# Patient Record
Sex: Male | Born: 1950 | Race: White | Hispanic: No | Marital: Married | Smoking: Former smoker
Health system: Southern US, Community
[De-identification: ages and names within clinical notes are randomized; demographics above are authoritative.]

## PROBLEM LIST (undated history)

## (undated) DIAGNOSIS — M1990 Unspecified osteoarthritis, unspecified site: Secondary | ICD-10-CM

## (undated) DIAGNOSIS — Z8719 Personal history of other diseases of the digestive system: Secondary | ICD-10-CM

## (undated) DIAGNOSIS — K219 Gastro-esophageal reflux disease without esophagitis: Secondary | ICD-10-CM

## (undated) DIAGNOSIS — I10 Essential (primary) hypertension: Secondary | ICD-10-CM

## (undated) DIAGNOSIS — R911 Solitary pulmonary nodule: Secondary | ICD-10-CM

## (undated) HISTORY — PX: SPLENECTOMY, TOTAL: SHX788

## (undated) HISTORY — PX: CYST EXCISION: SHX5701

---
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
## 1998-03-12 ENCOUNTER — Encounter: Admission: RE | Admit: 1998-03-12 | Discharge: 1998-06-10 | Payer: Self-pay | Admitting: Hematology and Oncology

## 1998-04-12 ENCOUNTER — Inpatient Hospital Stay (HOSPITAL_COMMUNITY): Admission: RE | Admit: 1998-04-12 | Discharge: 1998-04-15 | Payer: Self-pay | Admitting: Surgery

## 2019-04-29 ENCOUNTER — Other Ambulatory Visit: Payer: Self-pay | Admitting: Neurosurgery

## 2019-05-16 NOTE — Pre-Procedure Instructions (Signed)
Gabriel Atkins  05/16/2019      [facility] - [place], [place] [address] Phone: [phone] Fax: [phone]    Your procedure is scheduled on Wed., May 21, 2019 from 12:06PM-3:56PM  Report to [facility] Entrance "A" at 10:05AM  Call this number if you have problems the morning of surgery:  [phone]   Remember:  Do not eat or drink after midnight on July 14th    Take these medicines the morning of surgery with A SIP OF WATER: Metoprolol tartrate (LOPRESSOR)  If needed: Acetaminophen (TYLENOL) and Loratadine (CLARITIN)  Follow your surgeon's instructions on when to stop Aspirin.  If no instructions were given by your surgeon then you will need to call the office to get those instructions.    As of today, stop taking all Aspirin (unless instructed by your doctor) and Other Aspirin containing products, Vitamins, Fish oils, and Herbal medications. Also stop all NSAIDS i.e. Advil, Ibuprofen, Motrin, Aleve, Anaprox, Naproxen, BC, Goody Powders, and all Supplements.    Special instructions:   [facility]- Preparing For Surgery  Before surgery, you can play an important role. Because skin is not sterile, your skin needs to be as free of germs as possible. You can reduce the number of germs on your skin by washing with CHG (chlorahexidine gluconate) Soap before surgery.  CHG is an antiseptic cleaner which kills germs and bonds with the skin to continue killing germs even after washing.    Please do not use if you have an allergy to CHG or antibacterial soaps. If your skin becomes reddened/irritated stop using the CHG.  Do not shave (including legs and underarms) for at least 48 hours prior to first CHG shower. It is OK to shave your face.  Please follow these instructions carefully.   1. Shower the NIGHT BEFORE SURGERY and the MORNING OF SURGERY with CHG.   2. If you chose to wash your hair, wash your hair first as  usual with your normal shampoo.  3. After you shampoo, rinse your hair and body thoroughly to remove the shampoo.  4. Use CHG as you would any other liquid soap. You can apply CHG directly to the skin and wash gently with a scrungie or a clean washcloth.   5. Apply the CHG Soap to your body ONLY FROM THE NECK DOWN.  Do not use on open wounds or open sores. Avoid contact with your eyes, ears, mouth and genitals (private parts). Wash Face and genitals (private parts)  with your normal soap.  6. Wash thoroughly, paying special attention to the area where your surgery will be performed.  7. Thoroughly rinse your body with warm water from the neck down.  8. DO NOT shower/wash with your normal soap after using and rinsing off the CHG Soap.  9. Pat yourself dry with a CLEAN TOWEL.  10. Wear CLEAN PAJAMAS to bed the night before surgery, wear comfortable clothes the morning of surgery  11. Place CLEAN SHEETS on your bed the night of your first shower and DO NOT SLEEP WITH PETS.   Day of Surgery:   Oral Hygiene is also important to reduce your risk of infection.  Remember - BRUSH YOUR TEETH THE MORNING OF SURGERY WITH YOUR REGULAR TOOTHPASTE   Do not wear jewelry.  Do not wear lotions, powders, colognes, or deodorant.  Do not shave 48 hours prior to surgery.  Men  may shave face.  Do not bring valuables to the hospital.  [facility] is not responsible for any belongings or valuables.  Contacts, dentures or bridgework may not be worn into surgery.    For patients admitted to the hospital, discharge time will be determined by your treatment team.  Patients discharged the day of surgery will not be allowed to drive home.   Please wear clean clothes to the hospital/surgery center.    Please read over the following fact sheets that you were given. Pain Booklet, Coughing and Deep Breathing, MRSA Information and Surgical Site Infection Prevention

## 2019-05-19 ENCOUNTER — Other Ambulatory Visit (HOSPITAL_COMMUNITY)
Admission: RE | Admit: 2019-05-19 | Discharge: 2019-05-19 | Disposition: A | Discharge status: Home / Self Care | Payer: Medicare Other | Source: Ambulatory Visit | Attending: Neurosurgery | Admitting: Neurosurgery

## 2019-05-19 ENCOUNTER — Encounter (HOSPITAL_COMMUNITY): Payer: Self-pay

## 2019-05-19 ENCOUNTER — Other Ambulatory Visit: Payer: Self-pay

## 2019-05-19 ENCOUNTER — Encounter (HOSPITAL_COMMUNITY)
Admission: RE | Admit: 2019-05-19 | Discharge: 2019-05-19 | Disposition: A | Discharge status: Home / Self Care | Payer: Medicare Other | Source: Ambulatory Visit | Attending: Neurosurgery | Admitting: Neurosurgery

## 2019-05-19 HISTORY — DX: Essential (primary) hypertension: I10

## 2019-05-19 HISTORY — DX: Personal history of other diseases of the digestive system: Z87.19

## 2019-05-19 HISTORY — DX: Unspecified osteoarthritis, unspecified site: M19.90

## 2019-05-19 HISTORY — DX: Gastro-esophageal reflux disease without esophagitis: K21.9

## 2019-05-19 LAB — BASIC METABOLIC PANEL
Anion gap: 10 (ref 5–15)
BUN: 7 mg/dL — ABNORMAL LOW (ref 8–23)
CO2: 20 mmol/L — ABNORMAL LOW (ref 22–32)
Calcium: 9.6 mg/dL (ref 8.9–10.3)
Chloride: 109 mmol/L (ref 98–111)
Creatinine, Ser: 1.12 mg/dL (ref 0.61–1.24)
GFR calc Af Amer: >60 mL/min (ref >60)
GFR calc non Af Amer: >60 mL/min (ref >60)
Glucose, Bld: 133 mg/dL — ABNORMAL HIGH (ref 70–99)
Potassium: 4 mmol/L (ref 3.5–5.1)
Sodium: 139 mmol/L (ref 135–145)

## 2019-05-19 LAB — CBC
HCT: 48.3 % (ref 39.0–52.0)
Hemoglobin: 15.8 g/dL (ref 13.0–17.0)
MCH: 32.4 pg (ref 26.0–34.0)
MCHC: 32.7 g/dL (ref 30.0–36.0)
MCV: 99.2 fL (ref 80.0–100.0)
Platelets: 222 10*3/uL (ref 150–400)
RBC: 4.87 MIL/uL (ref 4.22–5.81)
RDW: 13.8 % (ref 11.5–15.5)
WBC: 8.2 10*3/uL (ref 4.0–10.5)
nRBC: 0 % (ref 0.0–0.2)

## 2019-05-19 LAB — SURGICAL PCR SCREEN
MRSA, PCR: NEGATIVE
Staphylococcus aureus: NEGATIVE

## 2019-05-19 NOTE — Progress Notes (Signed)
PCP - Dr. Lorra Hals  Cardiologist - Denies  Chest x-ray - Denies  EKG - 05/19/2019  Stress Test - Denies  ECHO - Denies  Cardiac Cath - Denies  AICD-na PM-na LOOP-na  Sleep Study - Denies CPAP - None  LABS- 05/19/2019: CBC, BMP, PCR, COVID  ASA- LD-7/10  ERAS- No   Anesthesia- No  Pt denies having chest pain, sob, or fever at this time. All instructions explained to the pt, with a verbal understanding of the material. Pt agrees to go over the instructions while at home for a better understanding. Pt also instructed to self quarantine after being tested for COVID-19. The opportunity to ask questions was provided.   Coronavirus Screening  Have you experienced the following symptoms:  Cough yes/no: No Fever (>100.76F)  yes/no: No Runny nose yes/no: No Sore throat yes/no: No Difficulty breathing/shortness of breath  yes/no: No  Have you or a family member traveled in the last 14 days and where? yes/no: No   If the patient indicates "YES" to the above questions, their PAT will be rescheduled to limit the exposure to others and, the surgeon will be notified. THE PATIENT WILL NEED TO BE ASYMPTOMATIC FOR 14 DAYS.   If the patient is not experiencing any of these symptoms, the PAT nurse will instruct them to NOT bring anyone with them to their appointment since they may have these symptoms or traveled as well.   Please remind your patients and families that hospital visitation restrictions are in effect and the importance of the restrictions.

## 2019-05-20 LAB — SARS CORONAVIRUS 2 (TAT 6-24 HRS): SARS Coronavirus 2: NEGATIVE

## 2019-05-21 ENCOUNTER — Inpatient Hospital Stay (HOSPITAL_COMMUNITY)
Admission: RE | Admit: 2019-05-21 | Discharge: 2019-05-22 | DRG: 473 | Disposition: A | Discharge status: Home Health | Payer: Medicare Other | Attending: Neurosurgery | Admitting: Neurosurgery

## 2019-05-21 ENCOUNTER — Inpatient Hospital Stay (HOSPITAL_COMMUNITY): Payer: Medicare Other | Admitting: Anesthesiology

## 2019-05-21 ENCOUNTER — Other Ambulatory Visit: Payer: Self-pay

## 2019-05-21 ENCOUNTER — Encounter (HOSPITAL_COMMUNITY): Payer: Self-pay | Admitting: *Deleted

## 2019-05-21 ENCOUNTER — Inpatient Hospital Stay (HOSPITAL_COMMUNITY): Payer: Medicare Other

## 2019-05-21 ENCOUNTER — Inpatient Hospital Stay (HOSPITAL_COMMUNITY)
Admission: RE | Disposition: A | Discharge status: Home / Self Care | Payer: Self-pay | Source: Home / Self Care | Attending: Neurosurgery

## 2019-05-21 DIAGNOSIS — Z9081 Acquired absence of spleen: Secondary | ICD-10-CM

## 2019-05-21 DIAGNOSIS — M4802 Spinal stenosis, cervical region: Secondary | ICD-10-CM | POA: Diagnosis present

## 2019-05-21 DIAGNOSIS — Z1159 Encounter for screening for other viral diseases: Secondary | ICD-10-CM

## 2019-05-21 DIAGNOSIS — Z885 Allergy status to narcotic agent status: Secondary | ICD-10-CM

## 2019-05-21 DIAGNOSIS — M1990 Unspecified osteoarthritis, unspecified site: Secondary | ICD-10-CM | POA: Diagnosis present

## 2019-05-21 DIAGNOSIS — Z87891 Personal history of nicotine dependence: Secondary | ICD-10-CM | POA: Diagnosis not present

## 2019-05-21 DIAGNOSIS — M2578 Osteophyte, vertebrae: Secondary | ICD-10-CM | POA: Diagnosis present

## 2019-05-21 DIAGNOSIS — Z881 Allergy status to other antibiotic agents status: Secondary | ICD-10-CM

## 2019-05-21 DIAGNOSIS — I10 Essential (primary) hypertension: Secondary | ICD-10-CM | POA: Diagnosis present

## 2019-05-21 DIAGNOSIS — Z91040 Latex allergy status: Secondary | ICD-10-CM

## 2019-05-21 DIAGNOSIS — J449 Chronic obstructive pulmonary disease, unspecified: Secondary | ICD-10-CM | POA: Diagnosis present

## 2019-05-21 DIAGNOSIS — M4712 Other spondylosis with myelopathy, cervical region: Principal | ICD-10-CM | POA: Diagnosis present

## 2019-05-21 DIAGNOSIS — Z7982 Long term (current) use of aspirin: Secondary | ICD-10-CM

## 2019-05-21 DIAGNOSIS — Z419 Encounter for procedure for purposes other than remedying health state, unspecified: Secondary | ICD-10-CM

## 2019-05-21 DIAGNOSIS — Z79899 Other long term (current) drug therapy: Secondary | ICD-10-CM | POA: Diagnosis not present

## 2019-05-21 DIAGNOSIS — K219 Gastro-esophageal reflux disease without esophagitis: Secondary | ICD-10-CM | POA: Diagnosis present

## 2019-05-21 DIAGNOSIS — G959 Disease of spinal cord, unspecified: Secondary | ICD-10-CM | POA: Diagnosis present

## 2019-05-21 HISTORY — PX: ANTERIOR CERVICAL DECOMPRESSION/DISCECTOMY FUSION 4 LEVELS: SHX5556

## 2019-05-21 SURGERY — ANTERIOR CERVICAL DECOMPRESSION/DISCECTOMY FUSION 4 LEVELS
Anesthesia: General | Site: Spine Cervical

## 2019-05-21 MED ORDER — SODIUM CHLORIDE 0.9 % IV SOLN: 250.0000 mL | INTRAVENOUS | Status: DC

## 2019-05-21 MED ORDER — DIPHENHYDRAMINE HCL 25 MG PO CAPS
25.0000 mg | ORAL_CAPSULE | Freq: Every day | ORAL | Status: DC
  Administered 2019-05-21: 25 mg via ORAL
  Filled 2019-05-21: qty 1

## 2019-05-21 MED ORDER — DEXAMETHASONE SODIUM PHOSPHATE 10 MG/ML IJ SOLN
10.0000 mg | INTRAMUSCULAR | Status: DC
  Filled 2019-05-21: qty 1

## 2019-05-21 MED ORDER — METOPROLOL TARTRATE 12.5 MG HALF TABLET
ORAL_TABLET | ORAL | Status: AC
  Administered 2019-05-21: 25 mg via ORAL
  Filled 2019-05-21: qty 2

## 2019-05-21 MED ORDER — ONDANSETRON HCL 4 MG/2ML IJ SOLN: 4.0000 mg | Freq: Four times a day (QID) | INTRAMUSCULAR | Status: DC | PRN

## 2019-05-21 MED ORDER — CHLORHEXIDINE GLUCONATE CLOTH 2 % EX PADS: 6.0000 | MEDICATED_PAD | Freq: Once | CUTANEOUS | Status: DC

## 2019-05-21 MED ORDER — ROCURONIUM BROMIDE 10 MG/ML (PF) SYRINGE
PREFILLED_SYRINGE | INTRAVENOUS | Status: DC | PRN
  Administered 2019-05-21: 20 mg via INTRAVENOUS
  Administered 2019-05-21: 60 mg via INTRAVENOUS
  Administered 2019-05-21: 20 mg via INTRAVENOUS
  Administered 2019-05-21 (×2): 10 mg via INTRAVENOUS

## 2019-05-21 MED ORDER — SODIUM CHLORIDE 0.9% FLUSH: 3.0000 mL | Freq: Two times a day (BID) | INTRAVENOUS | Status: DC

## 2019-05-21 MED ORDER — ROCURONIUM BROMIDE 10 MG/ML (PF) SYRINGE
PREFILLED_SYRINGE | INTRAVENOUS | Status: AC
  Filled 2019-05-21: qty 10

## 2019-05-21 MED ORDER — MIDAZOLAM HCL 2 MG/2ML IJ SOLN: 0.5000 mg | Freq: Once | INTRAMUSCULAR | Status: DC | PRN

## 2019-05-21 MED ORDER — SCOPOLAMINE 1 MG/3DAYS TD PT72
1.0000 | MEDICATED_PATCH | TRANSDERMAL | Status: DC
  Filled 2019-05-21: qty 1

## 2019-05-21 MED ORDER — SCOPOLAMINE 1 MG/3DAYS TD PT72
MEDICATED_PATCH | TRANSDERMAL | Status: AC
  Administered 2019-05-21: 12:00:00 1.5 mg via TRANSDERMAL
  Filled 2019-05-21: qty 1

## 2019-05-21 MED ORDER — SODIUM CHLORIDE 0.9 % IV SOLN
INTRAVENOUS | Status: DC | PRN
  Administered 2019-05-21: 500 mL

## 2019-05-21 MED ORDER — DEXAMETHASONE SODIUM PHOSPHATE 10 MG/ML IJ SOLN
INTRAMUSCULAR | Status: DC | PRN
  Administered 2019-05-21: 10 mg via INTRAVENOUS

## 2019-05-21 MED ORDER — MENTHOL 3 MG MT LOZG: 1.0000 | LOZENGE | OROMUCOSAL | Status: DC | PRN

## 2019-05-21 MED ORDER — ACETAMINOPHEN 650 MG RE SUPP: 650.0000 mg | RECTAL | Status: DC | PRN

## 2019-05-21 MED ORDER — 0.9 % SODIUM CHLORIDE (POUR BTL) OPTIME
TOPICAL | Status: DC | PRN
  Administered 2019-05-21: 1000 mL

## 2019-05-21 MED ORDER — LACTATED RINGERS IV SOLN
INTRAVENOUS | Status: DC
  Administered 2019-05-21 (×2): via INTRAVENOUS

## 2019-05-21 MED ORDER — PROPOFOL 10 MG/ML IV BOLUS
INTRAVENOUS | Status: DC | PRN
  Administered 2019-05-21: 120 mg via INTRAVENOUS

## 2019-05-21 MED ORDER — THROMBIN 5000 UNITS EX SOLR
CUTANEOUS | Status: AC
  Filled 2019-05-21: qty 5000

## 2019-05-21 MED ORDER — LIDOCAINE 4 % EX PTCH: MEDICATED_PATCH | Freq: Every day | CUTANEOUS | Status: DC | PRN

## 2019-05-21 MED ORDER — ACETAMINOPHEN 10 MG/ML IV SOLN
INTRAVENOUS | Status: AC
  Filled 2019-05-21: qty 100

## 2019-05-21 MED ORDER — CYCLOBENZAPRINE HCL 10 MG PO TABS
ORAL_TABLET | ORAL | Status: AC
  Filled 2019-05-21: qty 1

## 2019-05-21 MED ORDER — CYCLOBENZAPRINE HCL 10 MG PO TABS
10.0000 mg | ORAL_TABLET | Freq: Three times a day (TID) | ORAL | Status: DC | PRN
  Administered 2019-05-21 – 2019-05-22 (×2): 10 mg via ORAL
  Filled 2019-05-21: qty 1

## 2019-05-21 MED ORDER — METOPROLOL TARTRATE 25 MG PO TABS
25.0000 mg | ORAL_TABLET | Freq: Once | ORAL | Status: AC
  Administered 2019-05-21: 25 mg via ORAL
  Filled 2019-05-21: qty 1

## 2019-05-21 MED ORDER — ONDANSETRON HCL 4 MG PO TABS: 4.0000 mg | ORAL_TABLET | Freq: Four times a day (QID) | ORAL | Status: DC | PRN

## 2019-05-21 MED ORDER — OXYCODONE HCL 5 MG PO TABS
10.0000 mg | ORAL_TABLET | ORAL | Status: DC | PRN
  Administered 2019-05-21 – 2019-05-22 (×6): 10 mg via ORAL
  Filled 2019-05-21 (×5): qty 2

## 2019-05-21 MED ORDER — ACETAMINOPHEN 325 MG PO TABS
650.0000 mg | ORAL_TABLET | ORAL | Status: DC | PRN
  Administered 2019-05-21: 650 mg via ORAL
  Filled 2019-05-21: qty 2

## 2019-05-21 MED ORDER — PROMETHAZINE HCL 25 MG/ML IJ SOLN: 6.2500 mg | INTRAMUSCULAR | Status: DC | PRN

## 2019-05-21 MED ORDER — FENTANYL CITRATE (PF) 250 MCG/5ML IJ SOLN
INTRAMUSCULAR | Status: DC | PRN
  Administered 2019-05-21: 150 ug via INTRAVENOUS
  Administered 2019-05-21 (×2): 50 ug via INTRAVENOUS

## 2019-05-21 MED ORDER — LIDOCAINE 2% (20 MG/ML) 5 ML SYRINGE
INTRAMUSCULAR | Status: DC | PRN
  Administered 2019-05-21: 40 mg via INTRAVENOUS

## 2019-05-21 MED ORDER — OXYCODONE HCL 5 MG PO TABS
ORAL_TABLET | ORAL | Status: AC
  Filled 2019-05-21: qty 2

## 2019-05-21 MED ORDER — PROPOFOL 10 MG/ML IV BOLUS
INTRAVENOUS | Status: AC
  Filled 2019-05-21: qty 20

## 2019-05-21 MED ORDER — MICONAZOLE NITRATE 2 % EX CREA
1.0000 "application " | TOPICAL_CREAM | Freq: Every day | CUTANEOUS | Status: DC
  Administered 2019-05-21: 1 via TOPICAL
  Filled 2019-05-21: qty 28.4

## 2019-05-21 MED ORDER — BENAZEPRIL HCL 20 MG PO TABS
20.0000 mg | ORAL_TABLET | Freq: Every day | ORAL | Status: DC
  Administered 2019-05-22: 20 mg via ORAL
  Filled 2019-05-21 (×2): qty 1

## 2019-05-21 MED ORDER — PANTOPRAZOLE SODIUM 40 MG IV SOLR
40.0000 mg | Freq: Every day | INTRAVENOUS | Status: DC
  Administered 2019-05-21: 40 mg via INTRAVENOUS
  Filled 2019-05-21: qty 40

## 2019-05-21 MED ORDER — SCOPOLAMINE 1 MG/3DAYS TD PT72
1.0000 | MEDICATED_PATCH | Freq: Once | TRANSDERMAL | Status: DC
  Administered 2019-05-21: 1.5 mg via TRANSDERMAL

## 2019-05-21 MED ORDER — PHENYLEPHRINE 40 MCG/ML (10ML) SYRINGE FOR IV PUSH (FOR BLOOD PRESSURE SUPPORT)
PREFILLED_SYRINGE | INTRAVENOUS | Status: DC | PRN
  Administered 2019-05-21: 40 ug via INTRAVENOUS

## 2019-05-21 MED ORDER — METOPROLOL TARTRATE 25 MG PO TABS
25.0000 mg | ORAL_TABLET | Freq: Two times a day (BID) | ORAL | Status: DC
  Administered 2019-05-22: 25 mg via ORAL
  Filled 2019-05-21 (×2): qty 1

## 2019-05-21 MED ORDER — MIDAZOLAM HCL 5 MG/5ML IJ SOLN
INTRAMUSCULAR | Status: DC | PRN
  Administered 2019-05-21: 2 mg via INTRAVENOUS

## 2019-05-21 MED ORDER — HYDROMORPHONE HCL 1 MG/ML IJ SOLN
0.5000 mg | INTRAMUSCULAR | Status: DC | PRN
  Administered 2019-05-21: 0.5 mg via INTRAVENOUS
  Filled 2019-05-21: qty 0.5

## 2019-05-21 MED ORDER — MIDAZOLAM HCL 2 MG/2ML IJ SOLN
INTRAMUSCULAR | Status: AC
  Filled 2019-05-21: qty 2

## 2019-05-21 MED ORDER — SUGAMMADEX SODIUM 200 MG/2ML IV SOLN
INTRAVENOUS | Status: DC | PRN
  Administered 2019-05-21: 200 mg via INTRAVENOUS

## 2019-05-21 MED ORDER — CENTRUM SILVER ADULT 50+ PO TABS: 1.0000 | ORAL_TABLET | Freq: Every day | ORAL | Status: DC

## 2019-05-21 MED ORDER — SODIUM CHLORIDE 0.9% FLUSH: 3.0000 mL | INTRAVENOUS | Status: DC | PRN

## 2019-05-21 MED ORDER — ATORVASTATIN CALCIUM 40 MG PO TABS
40.0000 mg | ORAL_TABLET | Freq: Every day | ORAL | Status: DC
  Administered 2019-05-21: 40 mg via ORAL
  Filled 2019-05-21: qty 1

## 2019-05-21 MED ORDER — THROMBIN 20000 UNITS EX SOLR
CUTANEOUS | Status: AC
  Filled 2019-05-21: qty 20000

## 2019-05-21 MED ORDER — CEFAZOLIN SODIUM-DEXTROSE 2-4 GM/100ML-% IV SOLN
2.0000 g | INTRAVENOUS | Status: AC
  Administered 2019-05-21: 2 g via INTRAVENOUS
  Filled 2019-05-21: qty 100

## 2019-05-21 MED ORDER — FENTANYL CITRATE (PF) 250 MCG/5ML IJ SOLN
INTRAMUSCULAR | Status: AC
  Filled 2019-05-21: qty 5

## 2019-05-21 MED ORDER — ACETAMINOPHEN 500 MG PO TABS: 1000.0000 mg | ORAL_TABLET | Freq: Four times a day (QID) | ORAL | Status: DC | PRN

## 2019-05-21 MED ORDER — THROMBIN 20000 UNITS EX SOLR
CUTANEOUS | Status: DC | PRN
  Administered 2019-05-21: 13:00:00 20 mL via TOPICAL

## 2019-05-21 MED ORDER — THROMBIN 5000 UNITS EX SOLR
OROMUCOSAL | Status: DC | PRN
  Administered 2019-05-21 (×2): 5 mL via TOPICAL

## 2019-05-21 MED ORDER — ALBUMIN HUMAN 5 % IV SOLN
INTRAVENOUS | Status: DC | PRN
  Administered 2019-05-21: 16:00:00 via INTRAVENOUS

## 2019-05-21 MED ORDER — ALUM & MAG HYDROXIDE-SIMETH 200-200-20 MG/5ML PO SUSP: 30.0000 mL | Freq: Four times a day (QID) | ORAL | Status: DC | PRN

## 2019-05-21 MED ORDER — LIDOCAINE 5 % EX PTCH
1.0000 | MEDICATED_PATCH | Freq: Every day | CUTANEOUS | Status: DC | PRN
  Filled 2019-05-21: qty 1

## 2019-05-21 MED ORDER — MEPERIDINE HCL 25 MG/ML IJ SOLN: 6.2500 mg | INTRAMUSCULAR | Status: DC | PRN

## 2019-05-21 MED ORDER — HYDROMORPHONE HCL 1 MG/ML IJ SOLN
0.2500 mg | INTRAMUSCULAR | Status: DC | PRN
  Administered 2019-05-21 (×2): 0.5 mg via INTRAVENOUS

## 2019-05-21 MED ORDER — CEFAZOLIN SODIUM-DEXTROSE 2-4 GM/100ML-% IV SOLN
2.0000 g | Freq: Three times a day (TID) | INTRAVENOUS | Status: AC
  Administered 2019-05-21 – 2019-05-22 (×2): 2 g via INTRAVENOUS
  Filled 2019-05-21 (×2): qty 100

## 2019-05-21 MED ORDER — HYDROMORPHONE HCL 1 MG/ML IJ SOLN
INTRAMUSCULAR | Status: AC
  Filled 2019-05-21: qty 1

## 2019-05-21 MED ORDER — ACETAMINOPHEN 10 MG/ML IV SOLN
INTRAVENOUS | Status: DC | PRN
  Administered 2019-05-21: 1000 mg via INTRAVENOUS

## 2019-05-21 MED ORDER — SODIUM CHLORIDE 0.9 % IV SOLN
INTRAVENOUS | Status: DC | PRN
  Administered 2019-05-21: 13:00:00 20 ug/min via INTRAVENOUS

## 2019-05-21 MED ORDER — FAMOTIDINE 20 MG PO TABS
20.0000 mg | ORAL_TABLET | Freq: Every day | ORAL | Status: DC
  Filled 2019-05-21: qty 1

## 2019-05-21 MED ORDER — ONDANSETRON HCL 4 MG/2ML IJ SOLN
INTRAMUSCULAR | Status: DC | PRN
  Administered 2019-05-21: 4 mg via INTRAVENOUS

## 2019-05-21 MED ORDER — PHENOL 1.4 % MT LIQD: 1.0000 | OROMUCOSAL | Status: DC | PRN

## 2019-05-21 MED ORDER — LORATADINE 10 MG PO TABS
10.0000 mg | ORAL_TABLET | Freq: Every day | ORAL | Status: DC | PRN
  Administered 2019-05-21: 10 mg via ORAL
  Filled 2019-05-21: qty 1

## 2019-05-21 MED ORDER — ASPIRIN EC 325 MG PO TBEC
325.0000 mg | DELAYED_RELEASE_TABLET | Freq: Every day | ORAL | Status: DC
  Administered 2019-05-22: 325 mg via ORAL
  Filled 2019-05-21 (×2): qty 1

## 2019-05-21 MED ORDER — ADULT MULTIVITAMIN W/MINERALS CH
1.0000 | ORAL_TABLET | Freq: Every day | ORAL | Status: DC
  Administered 2019-05-22: 1 via ORAL
  Filled 2019-05-21: qty 1

## 2019-05-21 MED ORDER — LACTATED RINGERS IV SOLN
INTRAVENOUS | Status: DC | PRN
  Administered 2019-05-21: 13:00:00 via INTRAVENOUS

## 2019-05-21 SURGICAL SUPPLY — 67 items
BAG DECANTER FOR FLEXI CONT (MISCELLANEOUS) ×3 IMPLANT
BASKET BONE COLLECTION (BASKET) ×3 IMPLANT
BENZOIN TINCTURE PRP APPL 2/3 (GAUZE/BANDAGES/DRESSINGS) ×3 IMPLANT
BIT DRILL 13 (BIT) ×2 IMPLANT
BIT DRILL 13MM (BIT) ×1
BIT DRILL NEURO 2X3.1 SFT TUCH (MISCELLANEOUS) ×1 IMPLANT
BONE VIVIGEN FORMABLE 5.4CC (Bone Implant) ×3 IMPLANT
BUR MATCHSTICK NEURO 3.0 LAGG (BURR) ×3 IMPLANT
CANISTER SUCT 3000ML PPV (MISCELLANEOUS) ×3 IMPLANT
CARTRIDGE OIL MAESTRO DRILL (MISCELLANEOUS) ×1 IMPLANT
CLOSURE WOUND 1/2 X4 (GAUZE/BANDAGES/DRESSINGS) ×1
DERMABOND ADVANCED (GAUZE/BANDAGES/DRESSINGS) ×2
DERMABOND ADVANCED .7 DNX12 (GAUZE/BANDAGES/DRESSINGS) ×1 IMPLANT
DIFFUSER DRILL AIR PNEUMATIC (MISCELLANEOUS) ×3 IMPLANT
DRAIN SNY WOU 7FLT (WOUND CARE) ×3 IMPLANT
DRAPE C-ARM 42X72 X-RAY (DRAPES) ×6 IMPLANT
DRAPE LAPAROTOMY 100X72 PEDS (DRAPES) ×3 IMPLANT
DRAPE MICROSCOPE LEICA (MISCELLANEOUS) ×3 IMPLANT
DRILL NEURO 2X3.1 SOFT TOUCH (MISCELLANEOUS) ×3
DRSG OPSITE POSTOP 4X8 (GAUZE/BANDAGES/DRESSINGS) ×3 IMPLANT
DURAPREP 6ML APPLICATOR 50/CS (WOUND CARE) ×3 IMPLANT
ELECT COATED BLADE 2.86 ST (ELECTRODE) ×3 IMPLANT
ELECT REM PT RETURN 9FT ADLT (ELECTROSURGICAL) ×3
ELECTRODE REM PT RTRN 9FT ADLT (ELECTROSURGICAL) ×1 IMPLANT
EVACUATOR SILICONE 100CC (DRAIN) ×3 IMPLANT
GAUZE 4X4 16PLY RFD (DISPOSABLE) IMPLANT
GAUZE SPONGE 4X4 12PLY STRL (GAUZE/BANDAGES/DRESSINGS) IMPLANT
GLOVE BIOGEL PI IND STRL 7.0 (GLOVE) ×1 IMPLANT
GLOVE BIOGEL PI INDICATOR 7.0 (GLOVE) ×2
GLOVE SS N UNI LF 6.5 STRL (GLOVE) ×6 IMPLANT
GLOVE SS N UNI LF 7.0 STRL (GLOVE) ×6 IMPLANT
GLOVE SS N UNI LF 7.5 STRL (GLOVE) ×6 IMPLANT
GLOVE SS N UNI LF 8.0 STRL (GLOVE) ×6 IMPLANT
GOWN STRL REUS W/ TWL LRG LVL3 (GOWN DISPOSABLE) ×2 IMPLANT
GOWN STRL REUS W/ TWL XL LVL3 (GOWN DISPOSABLE) ×2 IMPLANT
GOWN STRL REUS W/TWL 2XL LVL3 (GOWN DISPOSABLE) IMPLANT
GOWN STRL REUS W/TWL LRG LVL3 (GOWN DISPOSABLE) ×4
GOWN STRL REUS W/TWL XL LVL3 (GOWN DISPOSABLE) ×4
HALTER HD/CHIN CERV TRACTION D (MISCELLANEOUS) ×3 IMPLANT
HEMOSTAT POWDER KIT SURGIFOAM (HEMOSTASIS) ×6 IMPLANT
KIT BASIN OR (CUSTOM PROCEDURE TRAY) ×3 IMPLANT
KIT TURNOVER KIT B (KITS) ×3 IMPLANT
NEEDLE SPNL 20GX3.5 QUINCKE YW (NEEDLE) ×3 IMPLANT
NS IRRIG 1000ML POUR BTL (IV SOLUTION) ×6 IMPLANT
OIL CARTRIDGE MAESTRO DRILL (MISCELLANEOUS) ×3
PACK LAMINECTOMY NEURO (CUSTOM PROCEDURE TRAY) ×3 IMPLANT
PAD ARMBOARD 7.5X6 YLW CONV (MISCELLANEOUS) ×9 IMPLANT
PIN DISTRACTION 14MM (PIN) ×6 IMPLANT
PLATE 4 77.5XLCK NS SPNE CVD (Plate) ×1 IMPLANT
PLATE 4 ATLANTIS TRANS (Plate) ×2 IMPLANT
RUBBERBAND STERILE (MISCELLANEOUS) ×6 IMPLANT
SCREW ANT CERV SD ATS 4X16 (Screw) ×3 IMPLANT
SCREW SPINAL 4.5X15 TITAN (Screw) ×9 IMPLANT
SCREW SPINAL 4X16MM SELF DRILL (Screw) ×3 IMPLANT
SCREW ST 15X4XST FXANG NS (Screw) ×1 IMPLANT
SCREW ST 16X4XST FXANG NS (Screw) ×6 IMPLANT
SCREW ST FIX 4 ATL (Screw) ×14 IMPLANT
SPACER ACDF TPS LG PARALLEL 7 (Spacer) ×6 IMPLANT
SPACER COLONIAL ACDF TPS LG S6 (Spacer) ×6 IMPLANT
SPONGE INTESTINAL PEANUT (DISPOSABLE) ×6 IMPLANT
SPONGE SURGIFOAM ABS GEL 100 (HEMOSTASIS) ×3 IMPLANT
STRIP CLOSURE SKIN 1/2X4 (GAUZE/BANDAGES/DRESSINGS) ×2 IMPLANT
SUT VIC AB 3-0 SH 8-18 (SUTURE) ×6 IMPLANT
SUT VIC AB 4-0 PS2 27 (SUTURE) ×3 IMPLANT
TOWEL GREEN STERILE (TOWEL DISPOSABLE) ×3 IMPLANT
TOWEL GREEN STERILE FF (TOWEL DISPOSABLE) ×3 IMPLANT
WATER STERILE IRR 1000ML POUR (IV SOLUTION) ×3 IMPLANT

## 2019-05-21 NOTE — Op Note (Signed)
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  Work-up revealed severe cervical stenosis and cord compression at C3-4, C4-5, C5-6, C6-7.  Due to patient's progression of clinical syndrome imaging findings and failed conservative treatment I recommended interest of discectomies and fusion at those levels.  I extensively went over the risks and benefits of the operation with him as well as perioperative course expectations of outcome and alternatives of surgery and he understood and agreed to proceed forward.  Operative procedure: Patient brought into the OR was due to general anesthesia positioned supine the neck in slight extension 5 pounds halter traction the right subjective prepped and draped in routine sterile fashion.  Preoperative x-ray localized the appropriate level so a curvilinear incision was made just off the midline to the interbody of the sternocleidomastoid and superficial layer platysma was dissected out divided longitudinally the avascular plane between the sternomastoid and strap muscle was developed under previous fashion.  Fascia dissected Kitners.  Intraoperative x-ray confirmed identification of the appropriate level.  So annulotomy was made at that level to mark the space longus goes reflected laterally and self-retaining retractors were placed.  Attention was first taken at C3-4 C4-5 and C5-6 large anterior osteophytes  were removed with a Leksell rongeur and a 2 and 3 mm Kerrison punch.  All 3 the spaces were drilled down to the posterior annulus and osteophytic complex.  I utilized distracting pins at C3-4 as the pathology here was severe degenerative collapse and large posterior osteophytes this was all removed decompressing central canal to the level of both C4 pedicles.  This was then packed with a 7 mm parallel TPS coated peek cage packed with the locally harvested autograft mixed and then inserted and distracting pins removed and then at C4-5 and C5-6 in a similar fashion large posterior ossified's severe foraminal stenosis from uncinate vertebrae this was all drilled down aggressively under bit and decompressing both the central canal and both foramina.  Two 6 mm globus peek cages were packed with locally harvested autograft were packed at these levels then at C6-7 I utilized a 7 mm cage the pathology here was primarily large central spur coming off the C7 vertebral body both C7 nerve roots were skeletonized flush with pedicle.  At the end decompression at all levels all foramina and central canal are widely decompressed 7 mm peek cage put at C6-7 in a 77-1/2 mm translational plate was selected and placed all screws had excellent purchase locking mechanisms were engaged.  I did replace 3 screws with 3 rescue screws to achieve good purchase.  Postop fluoroscopy confirmed good position of all the implants.  A drain was placed and was copiously irrigated meticulous hemostasis was maintained some additional bone graft was packed laterally to all the cages prior to plate placement as well as to the holes of the plate.  Then the wound was closed in layers with 0 Vicryl running 4-0 subcuticular Dermabond benzoin Steri-Strips and a sterile dressing was applied patient recovery in stable condition.  At the end  the case all needle count sponge counts were correct.

## 2019-05-21 NOTE — Progress Notes (Signed)
Ccollar applied per order.

## 2019-05-21 NOTE — H&P (Signed)
Gabriel Atkins is an 68 y.o. male.   Chief Complaint: Neck pain arm pain numbness tingling weakness in his hands HPI: 68 year old gentleman with progressive worsening neck pain arm pain and some weakness and tingling in his hands and fingers.  Work exam was consistent with a myelopathy and work-up revealed severe cord compression from cervical stenosis from C3-C7.  Due to patient's progression of clinical syndrome imaging findings and failed conservative treatment I recommended anterior cervical discectomy and fusion from C3-C7.  I have extensively gone over the risks and benefits of the operation with him as well as perioperative course expectations of outcome and alternatives of surgery and he understands and agrees to proceed forward.  Past Medical History:  Diagnosis Date  . Arthritis   . GERD (gastroesophageal reflux disease)   . History of hiatal hernia   . Hypertension     Past Surgical History:  Procedure Laterality Date  . CYST EXCISION     Left Groin  . SPLENECTOMY, TOTAL      History reviewed. No pertinent family history. Social History:  reports that he has quit smoking. He smoked 2.00 packs per day. He has never used smokeless tobacco. He reports previous alcohol use. No history on file for drug.  Allergies:  Allergies  Allergen Reactions  . Ciprofloxacin     Headaches, dizziness, cold sweats    . Codeine Nausea And Vomiting    Headaches, dizziness, cold sweats    . Morphine And Related     "doesn't work"  . Latex Rash    Medications Prior to Admission  Medication Sig Dispense Refill  . acetaminophen (TYLENOL) 500 MG tablet Take 1,000 mg by mouth every 6 (six) hours as needed for moderate pain or headache.    Marland Kitchen aspirin 325 MG EC tablet Take 325 mg by mouth daily.    Marland Kitchen atorvastatin (LIPITOR) 40 MG tablet Take 40 mg by mouth at bedtime.    . benazepril (LOTENSIN) 20 MG tablet Take 20 mg by mouth daily.    . diphenhydrAMINE (BENADRYL) 25 MG tablet Take 25 mg by  mouth at bedtime.    . famotidine (PEPCID) 20 MG tablet Take 20 mg by mouth at bedtime.    . Lidocaine (BLUE-EMU PAIN RELIEF DRY EX) Apply 1 application topically daily as needed (pain).    Marland Kitchen loratadine (CLARITIN) 10 MG tablet Take 10 mg by mouth daily as needed for allergies.    . metoprolol tartrate (LOPRESSOR) 25 MG tablet Take 25 mg by mouth 2 (two) times daily.    . miconazole (MICOTIN) 2 % cream Apply 1 application topically daily.    . Multiple Vitamins-Minerals (CENTRUM SILVER ADULT 50+) TABS Take 1 tablet by mouth daily.      Results for orders placed or performed during the hospital encounter of 05/19/19 (from the past 48 hour(s))  Surgical pcr screen     Status: None   Collection Time: 05/19/19  2:11 PM   Specimen: Nasal Mucosa; Nasal Swab  Result Value Ref Range   MRSA, PCR NEGATIVE NEGATIVE   Staphylococcus aureus NEGATIVE NEGATIVE    Comment: (NOTE) The Xpert SA Assay (FDA approved for NASAL specimens in patients 79 years of age and older), is one component of a comprehensive surveillance program. It is not intended to diagnose infection nor to guide or monitor treatment. Performed at [facility], [place] [address] [place], [place] [address]   Basic metabolic panel     Status: Abnormal   Collection  Time: 05/19/19  2:20 PM  Result Value Ref Range   Sodium 139 135 - 145 mmol/L   Potassium 4.0 3.5 - 5.1 mmol/L   Chloride 109 98 - 111 mmol/L   CO2 20 (L) 22 - 32 mmol/L   Glucose, Bld 133 (H) 70 - 99 mg/dL   BUN 7 (L) 8 - 23 mg/dL   Creatinine, Ser 1.12 0.61 - 1.24 mg/dL   Calcium 9.6 8.9 - 10.3 mg/dL   GFR calc non Af Amer >60 >60 mL/min   GFR calc Af Amer >60 >60 mL/min   Anion gap 10 5 - 15    Comment: Performed at [facility] [address] [place], [place] [address]  CBC     Status: None   Collection Time: 05/19/19  2:20 PM  Result Value Ref Range   WBC 8.2 4.0 - 10.5 K/uL   RBC 4.87 4.22 - 5.81 MIL/uL   Hemoglobin 15.8 13.0 - 17.0 g/dL   HCT  48.3 39.0 - 52.0 %   MCV 99.2 80.0 - 100.0 fL   MCH 32.4 26.0 - 34.0 pg   MCHC 32.7 30.0 - 36.0 g/dL   RDW 13.8 11.5 - 15.5 %   Platelets 222 150 - 400 K/uL   nRBC 0.0 0.0 - 0.2 %    Comment: Performed at [facility], [place] [address] [place], [place] [address]   No results found.  Review of Systems  Musculoskeletal: Positive for neck pain.  Neurological: Positive for tingling and sensory change.    Blood pressure (!) 152/90, pulse 90, temperature 98.5 F (36.9 C), temperature source Oral, resp. rate 20, height 5\' 9"  (1.753 m), weight 86 kg, SpO2 96 %. Physical Exam  Constitutional: He is oriented to person, place, and time. He appears well-developed.  HENT:  Head: Normocephalic.  Eyes: Pupils are equal, round, and reactive to light.  Neck: Normal range of motion.  Cardiovascular: Normal rate.  Respiratory: Effort normal.  GI: Soft.  Musculoskeletal: Normal range of motion.  Neurological: He is alert and oriented to person, place, and time. He has normal strength. GCS eye subscore is 4. GCS verbal subscore is 5. GCS motor subscore is 6.  Strength 5-5 deltoid, bicep, hand intrinsics 4+ out of 5 triceps 4 to 4+ out of 5 bilaterally  Skin: Skin is warm and dry.     Assessment/Plan 68 year old gentleman presents for ACDF C3-C7  Eastyn Skalla P, MD 05/21/2019, 11:42 AM

## 2019-05-21 NOTE — Anesthesia Preprocedure Evaluation (Signed)
Anesthesia Evaluation  Patient identified by MRN, date of birth, ID band Patient awake    Reviewed: Allergy & Precautions, NPO status , Patient's Chart, lab work & pertinent test results, reviewed documented beta blocker date and time   History of Anesthesia Complications Negative for: history of anesthetic complications  Airway Mallampati: II  TM Distance: >3 FB Neck ROM: Full    Dental  (+) Edentulous Upper, Poor Dentition, Missing, Dental Advisory Given, Chipped   Pulmonary COPD, former smoker,  05/19/2019 SARS coronavirus NEG   breath sounds clear to auscultation       Cardiovascular hypertension, Pt. on medications and Pt. on home beta blockers (-) angina Rhythm:Regular Rate:Normal     Neuro/Psych cervicalgia    GI/Hepatic Neg liver ROS, GERD  Medicated and Controlled,  Endo/Other  negative endocrine ROS  Renal/GU negative Renal ROS     Musculoskeletal   Abdominal   Peds  Hematology negative hematology ROS (+)   Anesthesia Other Findings   Reproductive/Obstetrics                             Anesthesia Physical Anesthesia Plan  ASA: II  Anesthesia Plan: General   Post-op Pain Management:    Induction: Intravenous  PONV Risk Score and Plan: 3 and Scopolamine patch - Pre-op, Dexamethasone and Ondansetron  Airway Management Planned: Oral ETT  Additional Equipment:   Intra-op Plan:   Post-operative Plan: Extubation in OR  Informed Consent: I have reviewed the patients History and Physical, chart, labs and discussed the procedure including the risks, benefits and alternatives for the proposed anesthesia with the patient or authorized representative who has indicated his/her understanding and acceptance.     Dental advisory given  Plan Discussed with: CRNA and Surgeon  Anesthesia Plan Comments:         Anesthesia Quick Evaluation

## 2019-05-21 NOTE — Anesthesia Procedure Notes (Signed)
Procedure Name: Intubation Date/Time: 05/21/2019 12:36 PM Performed by: Marsa Aris, CRNA Pre-anesthesia Checklist: Patient identified, Emergency Drugs available, Suction available and Patient being monitored Patient Re-evaluated:Patient Re-evaluated prior to induction Oxygen Delivery Method: Circle System Utilized Preoxygenation: Pre-oxygenation with 100% oxygen Induction Type: IV induction Ventilation: Mask ventilation without difficulty Laryngoscope Size: Glidescope and 4 Grade View: Grade I Tube type: Oral Number of attempts: 1 Airway Equipment and Method: Stylet and Bite block Placement Confirmation: ETT inserted through vocal cords under direct vision,  positive ETCO2 and breath sounds checked- equal and bilateral Secured at: 22 cm Tube secured with: Tape Dental Injury: Teeth and Oropharynx as per pre-operative assessment  Comments: No change in dentition from pre-procedure, c-spine neutrality maintained

## 2019-05-21 NOTE — Transfer of Care (Signed)
Immediate Anesthesia Transfer of Care Note  Patient: Narda Rutherford  Procedure(s) Performed: ANTERIOR CERVICAL DECOMPRESSION/DISCECTOMY FUSION - CERVICAL THREE-FOUR, CERVICAL FOUR-FIVE, CERVICAL FIVE-SIX, CERVICAL SIX-SEVEN (N/A Spine Cervical)  Patient Location: PACU  Anesthesia Type:General  Level of Consciousness: awake, alert  and pateint uncooperative  Airway & Oxygen Therapy: Patient Spontanous Breathing and Patient connected to face mask oxygen  Post-op Assessment: Report given to RN and Post -op Vital signs reviewed and stable  Post vital signs: Reviewed and stable  Last Vitals:  Vitals Value Taken Time  BP 146/74 05/21/19 1701  Temp    Pulse 99 05/21/19 1703  Resp 15 05/21/19 1703  SpO2 96 % 05/21/19 1703  Vitals shown include unvalidated device data.  Last Pain:  Vitals:   05/21/19 1700  TempSrc:   PainSc: (P) 0-No pain      Patients Stated Pain Goal: 4 (48/18/59 0931)  Complications: No apparent anesthesia complications

## 2019-05-21 NOTE — Anesthesia Postprocedure Evaluation (Signed)
Anesthesia Post Note  Patient: Teacher, music  Procedure(s) Performed: ANTERIOR CERVICAL DECOMPRESSION/DISCECTOMY FUSION - CERVICAL THREE-FOUR, CERVICAL FOUR-FIVE, CERVICAL FIVE-SIX, CERVICAL SIX-SEVEN (N/A Spine Cervical)     Patient location during evaluation: PACU Anesthesia Type: General Level of consciousness: awake and alert, oriented and patient cooperative Pain management: pain level controlled (pain improving) Vital Signs Assessment: post-procedure vital signs reviewed and stable Respiratory status: spontaneous breathing, nonlabored ventilation, respiratory function stable and patient connected to nasal cannula oxygen Cardiovascular status: blood pressure returned to baseline and stable Postop Assessment: no apparent nausea or vomiting Anesthetic complications: no    Last Vitals:  Vitals:   05/21/19 1701 05/21/19 1714  BP: (!) 146/74 117/65  Pulse: (!) 102 95  Resp: (!) 26 (!) 8  Temp:    SpO2: 94% 97%    Last Pain:  Vitals:   05/21/19 1727  TempSrc:   PainSc: 0-No pain                 Gabriel Atkins,E. Rolin Schult

## 2019-05-22 MED ORDER — OXYCODONE-ACETAMINOPHEN 5-325 MG PO TABS: 1.0000 | ORAL_TABLET | ORAL | 0 refills | Status: DC | PRN

## 2019-05-22 MED ORDER — METHOCARBAMOL 500 MG PO TABS: 500.0000 mg | ORAL_TABLET | Freq: Four times a day (QID) | ORAL | 0 refills | Status: DC

## 2019-05-22 MED FILL — Thrombin For Soln 5000 Unit: CUTANEOUS | Qty: 5000 | Status: AC

## 2019-05-22 MED FILL — Gelatin Absorbable MT Powder: OROMUCOSAL | Qty: 1 | Status: AC

## 2019-05-22 NOTE — Evaluation (Signed)
Occupational Therapy Evaluation Patient Details Name: Gabriel Atkins MRN: [id number] DOB: [birth year] Today's Date: 05/22/2019    History of Present Illness Pt is a 68 y/o male now s/p ACDF C3-C7. PMHx includes HTN, arthritis   Clinical Impression   This 68 y/o male presents with the above. PTA pt reports he was independent with ADL And functional mobiltiy. Pt presenting with generalized weakness, L UE deficits including decreased fine motor, proprioception and sensation, decreased balance impacting his functional performance. Pt currently requires minA using RW for room level mobility, requires min cues for maintaining closer proximity to RW and for advancing LLE as pt intermittently tending to let it lag. He requires setup assist for seated UB ADL, minA for toileting, standing grooming and LB ADL. Pt requires increased time for functional tasks given LUE deficits. Issued theraputty and fine motor/coordination HEP and reviewed this session. Further reviewed and educated on cervical precautions, safety and compensatory techniques for performing ADL and functional transfers at home. Pt will benefit from continued acute OT services and recommend follow up [facility] services after discharge to further address LUE deficits and to maximize his safety and independence with ADL and mobility. Will follow.     Follow Up Recommendations  Home health OT;Supervision/Assistance - 24 hour    Equipment Recommendations  None recommended by OT           Precautions / Restrictions Precautions Precautions: Cervical Precaution Booklet Issued: Yes (comment) Precaution Comments: issued and reviewed; cued for precautions during session Required Braces or Orthoses: Cervical Brace Cervical Brace: Hard collar;Other (comment)(per order set, may remove to shower) Restrictions Weight Bearing Restrictions: No      Mobility Bed Mobility               General bed mobility comments: pt received OOB in  recliner  Transfers Overall transfer level: Needs assistance Equipment used: Rolling walker (2 wheeled) Transfers: Sit to/from Stand Sit to Stand: Min guard         General transfer comment: close minguard for safety/balance, demonstrates safe UE placement for sit<>stand but does require cues for placement of LUE onto RW as pt unable to locate initially when not looking    Balance Overall balance assessment: Needs assistance Sitting-balance support: Feet supported Sitting balance-Leahy Scale: Fair     Standing balance support: Bilateral upper extremity supported;During functional activity;No upper extremity supported Standing balance-Leahy Scale: Poor Standing balance comment: requires UE support/external assist                           ADL either performed or assessed with clinical judgement   ADL Overall ADL's : Needs assistance/impaired Eating/Feeding: Modified independent;Sitting Eating/Feeding Details (indicate cue type and reason): pt able to open drink can end of session Grooming: Wash/dry hands;Minimal assistance;Standing Grooming Details (indicate cue type and reason): for standing balance Upper Body Bathing: Min guard;Sitting   Lower Body Bathing: Minimal assistance;Sit to/from stand Lower Body Bathing Details (indicate cue type and reason): for balance; educated to perform bathing tasks in sitting vs standing after return home Upper Body Dressing : Min guard;Sitting   Lower Body Dressing: Minimal assistance;Sit to/from stand Lower Body Dressing Details (indicate cue type and reason): pt requires increased effort to adjust socks due to LUE deficits - difficulty finding and gripping sock without use of vision Toilet Transfer: Minimal assistance;Ambulation;RW Toilet Transfer Details (indicate cue type and reason): minA for balance with mobility Toileting- Clothing Manipulation and Hygiene: Minimal assistance;Sit to/from  stand Toileting - Clothing  Manipulation Details (indicate cue type and reason): for gown management;pt able to manage pants without assist   Tub/Shower Transfer Details (indicate cue type and reason): educated pt to sponge bathe initially until he feels stable to step into tub Functional mobility during ADLs: Minimal assistance;Rolling walker General ADL Comments: pt with decreased balance, decreased proprioception of L side of body                         Pertinent Vitals/Pain Pain Assessment: Faces Faces Pain Scale: Hurts a little bit Pain Location: incisional Pain Descriptors / Indicators: Discomfort;Grimacing Pain Intervention(s): Monitored during session;Repositioned;Limited activity within patient's tolerance     Hand Dominance Right   Extremity/Trunk Assessment Upper Extremity Assessment Upper Extremity Assessment: RUE deficits/detail;LUE deficits/detail;Generalized weakness RUE Deficits / Details: weakness, mild decreased fine motor/coordination  RUE Sensation: decreased light touch(radial distribution) RUE Coordination: decreased fine motor LUE Deficits / Details: weakness, decreased fine motor/coordination and grip strength but most notably pt with decreased proprioception of LUE during functional tasks, impaired sensation  LUE Sensation: decreased light touch;decreased proprioception LUE Coordination: decreased fine motor   Lower Extremity Assessment Lower Extremity Assessment: Defer to PT evaluation   Cervical / Trunk Assessment Cervical / Trunk Assessment: Other exceptions Cervical / Trunk Exceptions: s/c cervical sx   Communication Communication Communication: No difficulties   Cognition Arousal/Alertness: Awake/alert Behavior During Therapy: WFL for tasks assessed/performed Overall Cognitive Status: No family/caregiver present to determine baseline cognitive functioning                                 General Comments: pt with noted decreased STM, did not recall  session with PT initially (once cued able to recall he walked in the hallway). pt also stating there was a spider crawling up the wall upon arrival, but pt was seeing a screw placed in the wall    General Comments       Exercises Exercises: Other exercises Other Exercises Other Exercises: issued pt yellow theraputty/putty HEP and fine motor/coordination exercises, reviewed with pt with min cues for technique   Shoulder Instructions      Home Living Family/patient expects to be discharged to:: Private residence Living Arrangements: Spouse/significant other                 Bathroom Shower/Tub: Teacher, early years/pre: Standard     Home Equipment: Civil engineer, contracting;Toilet riser          Prior Functioning/Environment Level of Independence: Independent                 OT Problem List: Decreased strength;Decreased range of motion;Decreased activity tolerance;Impaired balance (sitting and/or standing);Decreased coordination;Decreased safety awareness;Decreased knowledge of use of DME or AE;Decreased knowledge of precautions;Impaired UE functional use;Impaired sensation      OT Treatment/Interventions:      OT Goals(Current goals can be found in the care plan section) Acute Rehab OT Goals Patient Stated Goal: regain UE strength OT Goal Formulation: With patient Time For Goal Achievement: 06/05/19 Potential to Achieve Goals: Good  OT Frequency:     Barriers to D/C:            Co-evaluation              AM-PAC OT "6 Clicks" Daily Activity     Outcome Measure Help from another person eating meals?: None Help from another person  taking care of personal grooming?: A Little Help from another person toileting, which includes using toliet, bedpan, or urinal?: A Little Help from another person bathing (including washing, rinsing, drying)?: A Little Help from another person to put on and taking off regular upper body clothing?: A Little Help from another  person to put on and taking off regular lower body clothing?: A Little 6 Click Score: 19   End of Session Equipment Utilized During Treatment: Gait belt;Rolling walker;Cervical collar  Activity Tolerance: Patient tolerated treatment well Patient left: in chair;with call bell/phone within reach  OT Visit Diagnosis: Unsteadiness on feet (R26.81);Other abnormalities of gait and mobility (R26.89);Muscle weakness (generalized) (M62.81)                Time: 0165-8006 OT Time Calculation (min): 37 min Charges:  OT General Charges $OT Visit: 1 Visit OT Evaluation $OT Eval Moderate Complexity: 1 Mod OT Treatments $Self Care/Home Management : 8-22 mins  Lou Cal, OT Supplemental Rehabilitation Services Pager [phone] Office [phone]   Gabriel Atkins 05/22/2019, 9:49 AM

## 2019-05-22 NOTE — Discharge Instructions (Signed)
Wound Care Keep incision covered and dry for one week.  If you shower prior to then, cover incision with plastic wrap.  You may remove outer bandage after one week and shower.  Do not put any creams, lotions, or ointments on incision. Leave steri-strips on neck.  They will fall off by themselves. Activity Walk each and every day, increasing distance each day. No lifting greater than 5 lbs.  Avoid excessive neck motion. No driving for 2 weeks; may ride as a passenger locally. Wear neck brace at all times except when showering or otherwise instructed. Diet Resume your normal diet.  Return to Work Will be discussed at you follow up appointment. Call Your Doctor If Any of These Occur Redness, drainage, or swelling at the wound.  Temperature greater than 101 degrees. Severe pain not relieved by pain medication. Increased difficulty swallowing.  Incision starts to come apart. Follow Up Appt Call today for appointment in 1-2 weeks ([phone]) or for problems.  If you have any hardware placed in your spine, you will need an x-ray before your appointment.   Anterior Cervical Diskectomy and Fusion, Care After This sheet gives you information about how to care for yourself after your procedure. Your health care provider may also give you more specific instructions. If you have problems or questions, contact your health care provider. What can I expect after the procedure? After the procedure, it is common to have:  Neck pain.  Discomfort when swallowing.  Slight hoarseness. Follow these instructions at home: If you have a neck brace:  Wear it as told by your health care provider. Remove it only as told by your health care provider.  Keep the brace clean and dry.  Ask your health care provider if you should remove the brace to bathe or shower. Incision care   Follow instructions from your health care provider about how to take care of your incision. Make sure you: ? Wash your hands  with soap and water before and after you change your bandage (dressing). If soap and water are not available, use hand sanitizer. ? Change your dressing as told by your health care provider. ? Leave stitches (sutures), skin glue, or adhesive strips in place. These skin closures may need to stay in place for 2 weeks or longer. If adhesive strip edges start to loosen and curl up, you may trim the loose edges. Do not remove adhesive strips completely unless your health care provider tells you to do that.  Check your incision area every day for signs of infection. Check for: ? Redness, swelling, or pain. ? Fluid or blood. ? Warmth. ? Pus or a bad smell. Managing pain, stiffness, and swelling   Take over-the-counter and prescription medicines only as told by your health care provider.  If directed, put ice on the injured area. ? If you have a removable brace, remove it as told by your health care provider. ? Put ice in a plastic bag. ? Place a towel between your skin and the bag. ? Leave the ice on for 20 minutes, 2-3 times a day. Activity   Return to your normal activities as told by your health care provider. Ask your health care provider what activities are safe for you.  Do exercises as told by your health care provider.  Do not take baths, swim, or use a hot tub until your health care provider approves.  Do not lift anything that is heavier than 10 lb (4.5 kg), or the limit that  you are told, until your health care provider says that it is safe. General instructions  Ask your health care provider if the medicine prescribed to you: ? Requires you to avoid driving or using heavy machinery. ? Can cause constipation. You may need to take actions to prevent or treat constipation, such as:  Drink enough fluid to keep your urine pale yellow.  Take over-the-counter or prescription medicines.  Eat foods that are high in fiber, such as beans, whole grains, and fresh fruits and  vegetables.  Limit foods that are high in fat and processed sugars, such as fried and sweet foods.  Do not use any products that contain nicotine or tobacco, such as cigarettes, e-cigarettes, and chewing tobacco. These can delay healing. If you need help quitting, ask your health care provider.  Keep all follow-up visits and physical therapy appointments as told by your health care provider. This is important. Contact a health care provider if you have:  A fever.  Redness, swelling, or pain around your incision.  Fluid or blood coming from your incision.  Pus or a bad smell coming from your incision.  Pain that is not controlled by your pain medicine.  Increasing hoarseness or trouble swallowing. Get help right away if you have:  Severe pain.  Sudden numbness or weakness in your arms.  Warmth, tenderness, or swelling in your calf.  Chest pain.  Difficulty breathing. Summary  After the procedure, it is common to have neck pain, discomfort when swallowing, and slight hoarseness.  Follow instructions from your health care provider about how to take care of your incision.  Check your incision area every day for signs of infection.  Return to your normal activities as told by your health care provider. Ask your health care provider what activities are safe for you.  Contact a health care provider if you have signs of infection at your incision. This information is not intended to replace advice given to you by your health care provider. Make sure you discuss any questions you have with your health care provider. Document Released: 11/19/2015 Document Revised: 07/18/2018 Document Reviewed: 07/18/2018 Elsevier Patient Education  2020 [employer].

## 2019-05-22 NOTE — Evaluation (Signed)
Physical Therapy Evaluation Patient Details Name: Gabriel Atkins MRN: [id number] DOB: 1951-11-03 Today's Date: 05/22/2019   History of Present Illness  Pt is a 68 y/o male now s/p ACDF C3-C7. PMHx includes HTN, arthritis  Clinical Impression  Pt admitted with above diagnosis. Pt currently with functional limitations due to the deficits listed below (see PT Problem List). At the time of PT eval pt was able to perform transfers and ambulation with up to mod assist without an AD, and up to min assist with a RW. Pt was educated on brace management, precautions, car transfer, and activity progression. Pt will benefit from skilled PT to increase their independence and safety with mobility to allow discharge to the venue listed below.       Follow Up Recommendations Home health PT;Supervision for mobility/OOB    Equipment Recommendations  Rolling walker with 5" wheels    Recommendations for Other Services       Precautions / Restrictions Precautions Precautions: Cervical Precaution Booklet Issued: Yes (comment) Precaution Comments: issued and reviewed; cued for precautions during session Required Braces or Orthoses: Cervical Brace Cervical Brace: Hard collar;Other (comment)(per order set, may remove to shower) Restrictions Weight Bearing Restrictions: No      Mobility  Bed Mobility Overal bed mobility: Needs Assistance Bed Mobility: Rolling;Sidelying to Sit Rolling: Supervision Sidelying to sit: Min guard       General bed mobility comments: VC's for proper log roll technique.   Transfers Overall transfer level: Needs assistance Equipment used: Rolling walker (2 wheeled) Transfers: Sit to/from Stand Sit to Stand: Min guard         General transfer comment: Hands-on guarding required for transfers. Pt unsteady and requires assist without an AD.   Ambulation/Gait Ambulation/Gait assistance: Min assist;Mod assist Gait Distance (Feet): 200 Feet Assistive device: Rolling  walker (2 wheeled);None Gait Pattern/deviations: Step-through pattern Gait velocity: Decreased Gait velocity interpretation: 1.31 - 2.62 ft/sec, indicative of limited community ambulator General Gait Details: VC's for sequencing and general safety with the RW. Pt initially insistent he could ambulate without an AD however pt very unsteady and requiring up to mod assist for balance support and safety.   Stairs Stairs: Yes Stairs assistance: Min assist Stair Management: Two rails;Step to pattern;Forwards Number of Stairs: 4 General stair comments: VC's for sequencing and general safety.   Wheelchair Mobility    Modified Rankin (Stroke Patients Only)       Balance Overall balance assessment: Needs assistance Sitting-balance support: Feet supported Sitting balance-Leahy Scale: Fair     Standing balance support: Bilateral upper extremity supported;During functional activity;No upper extremity supported Standing balance-Leahy Scale: Poor Standing balance comment: requires UE support/external assist                             Pertinent Vitals/Pain Pain Assessment: Faces Faces Pain Scale: Hurts little more Pain Location: incisional Pain Descriptors / Indicators: Discomfort;Grimacing Pain Intervention(s): Monitored during session    Home Living Family/patient expects to be discharged to:: Private residence Living Arrangements: Spouse/significant other Available Help at Discharge: Family;Neighbor Type of Home: House Home Access: Stairs to enter   [employer] of Steps: 4 Home Layout: One level Home Equipment: Shower seat;Toilet riser      Prior Function Level of Independence: Independent               Hand Dominance   Dominant Hand: Right    Extremity/Trunk Assessment   Upper Extremity Assessment Upper Extremity  Assessment: RUE deficits/detail;LUE deficits/detail RUE Deficits / Details: weakness, mild decreased fine motor/coordination   RUE Sensation: decreased light touch(radial distribution) RUE Coordination: decreased fine motor LUE Deficits / Details: weakness, decreased fine motor/coordination and grip strength but most notably pt with decreased proprioception of LUE during functional tasks, impaired sensation  LUE Sensation: decreased light touch;decreased proprioception LUE Coordination: decreased fine motor    Lower Extremity Assessment Lower Extremity Assessment: Generalized weakness    Cervical / Trunk Assessment Cervical / Trunk Assessment: Other exceptions Cervical / Trunk Exceptions: s/p surgery  Communication   Communication: No difficulties  Cognition Arousal/Alertness: Awake/alert Behavior During Therapy: WFL for tasks assessed/performed Overall Cognitive Status: No family/caregiver present to determine baseline cognitive functioning                                 General Comments: pt with noted decreased STM, did not recall session with PT initially (once cued able to recall he walked in the hallway). pt also stating there was a spider crawling up the wall upon arrival, but pt was seeing a screw placed in the wall       General Comments      Exercises Other Exercises Other Exercises: issued pt yellow theraputty/putty HEP and fine motor/coordination exercises, reviewed with pt with min cues for technique   Assessment/Plan    PT Assessment Patient needs continued PT services  PT Problem List Decreased strength;Decreased activity tolerance;Decreased balance;Decreased mobility;Decreased knowledge of use of DME;Decreased cognition;Decreased safety awareness;Decreased knowledge of precautions;Pain       PT Treatment Interventions DME instruction;Gait training;Functional mobility training;Therapeutic activities;Therapeutic exercise;Stair training;Neuromuscular re-education;Patient/family education    PT Goals (Current goals can be found in the Care Plan section)  Acute Rehab PT  Goals Patient Stated Goal: regain UE strength PT Goal Formulation: With patient Time For Goal Achievement: 05/29/19 Potential to Achieve Goals: Good    Frequency Min 5X/week   Barriers to discharge Decreased caregiver support Wife is disabled per pt report and unsure how much assist she can provide to pt at home. Has a neighbor that helps out as well.     Co-evaluation               AM-PAC PT "6 Clicks" Mobility  Outcome Measure Help needed turning from your back to your side while in a flat bed without using bedrails?: None Help needed moving from lying on your back to sitting on the side of a flat bed without using bedrails?: A Little Help needed moving to and from a bed to a chair (including a wheelchair)?: A Little Help needed standing up from a chair using your arms (e.g., wheelchair or bedside chair)?: A Little Help needed to walk in hospital room?: A Little Help needed climbing 3-5 steps with a railing? : A Little 6 Click Score: 19    End of Session Equipment Utilized During Treatment: Gait belt Activity Tolerance: Patient tolerated treatment well Patient left: in chair;with call bell/phone within reach Nurse Communication: Mobility status PT Visit Diagnosis: Unsteadiness on feet (R26.81);Pain;Other symptoms and signs involving the nervous system (R29.898) Pain - part of body: (neck)    Time: 5916-3846 PT Time Calculation (min) (ACUTE ONLY): 35 min   Charges:   PT Evaluation $PT Eval Moderate Complexity: 1 Mod PT Treatments $Gait Training: 8-22 mins        Rolinda Roan, PT, DPT Acute Rehabilitation Services Pager: [phone] Office: August  Ashlei Chinchilla 05/22/2019, 11:14 AM

## 2019-05-22 NOTE — TOC Initial Note (Signed)
Transition of Care [place]) - Initial/Assessment Note    Patient Details  Name: Gabriel Atkins MRN: [id number] Date of Birth: August 20, 1951  Transition of Care [facility] CM/SW Contact:    Benard Halsted, LCSW Phone Number: 05/22/2019, 11:07 AM  Clinical Narrative:                 CSW received consult for possible home health services at time of discharge. CSW spoke with patient regarding PT recommendation of Home Health PT at time of discharge. Patient reported that he would like home health services.  CSW provided Medicare SNF ratings list. [facility] able to accept patient. CSW confirmed PCP [facility], Dr. Chapman Fitch) and address with patient. No further questions reported at this time. CSW to continue to follow and assist with discharge planning needs.   Expected Discharge Plan: [place] Barriers to Discharge: No Barriers Identified   Patient Goals and CMS Choice Patient states their goals for this hospitalization and ongoing recovery are:: Return home CMS Medicare.gov Compare Post Acute Care list provided to:: Patient Choice offered to / list presented to : Patient  Expected Discharge Plan and Services Expected Discharge Plan: [place] In-house Referral: NA Discharge Planning Services: CM Consult Post Acute Care Choice: [place] arrangements for the past 2 months: Single Family Home                 DME Arranged: (Nurse arranging equipment)         HH Arranged: OT, PT [place] Agency: [place] ([facility]) Date HH Agency Contacted: 05/22/19 Time [place]: 1106 Representative spoke with at [place]: Butch Penny  Prior Living Arrangements/Services Living arrangements for the past 2 months: [place] Lives with:: Spouse Patient language and need for interpreter reviewed:: Yes Do you feel safe going back to the place where you live?: Yes      Need for Family Participation in Patient  Care: No (Comment) Care giver support system in place?: Yes (comment)   Criminal Activity/Legal Involvement Pertinent to Current Situation/Hospitalization: No - Comment as needed  Activities of Daily Living      Permission Sought/Granted Permission sought to share information with : Facility Art therapist granted to share information with : Yes, Verbal Permission Granted     Permission granted to share info w AGENCY: Home Health        Emotional Assessment Appearance:: Appears stated age Attitude/Demeanor/Rapport: Gracious Affect (typically observed): Accepting, Appropriate Orientation: : Oriented to Self, Oriented to Place, Oriented to  Time, Oriented to Situation Alcohol / Substance Use: Not Applicable Psych Involvement: No (comment)  Admission diagnosis:  Myelopathy ([place]) [G95.9] Patient Active Problem List   Diagnosis Date Noted  . Myelopathy ([place]) 05/21/2019   PCP:  Patient, No Pcp Per Pharmacy:   [facility] [place] [address] [place] [address] Phone: [phone] Fax: [phone]     Social Determinants of Health (SDOH) Interventions    Readmission Risk Interventions No flowsheet data found.

## 2019-05-22 NOTE — Progress Notes (Signed)
Subjective: Patient reports Patient is better improved upper extremity symptoms neck pain and swallowing manageable  Objective: Vital signs in last 24 hours: Temp:  [97.1 F (36.2 C)-98.9 F (37.2 C)] 98.3 F (36.8 C) (07/16 0729) Pulse Rate:  [68-105] 68 (07/16 0729) Resp:  [8-27] 18 (07/16 0729) BP: (105-152)/(57-90) 112/63 (07/16 0729) SpO2:  [91 %-97 %] 91 % (07/16 0729) Weight:  [86 kg] 86 kg (07/15 1023)  Intake/Output from previous day: 07/15 0701 - 07/16 0700 In: 2650 [I.V.:2200; IV Piggyback:450] Out: 076 [Urine:300; Drains:75; Blood:400] Intake/Output this shift: No intake/output data recorded.  Mobilize with physical occupational therapy will plan discharge later this afternoon after he works with a physical therapist to make sure he does not have any home needs.  Lab Results: Recent Labs    05/19/19 1420  WBC 8.2  HGB 15.8  HCT 48.3  PLT 222   BMET Recent Labs    05/19/19 1420  NA 139  K 4.0  CL 109  CO2 20*  GLUCOSE 133*  BUN 7*  CREATININE 1.12  CALCIUM 9.6    Studies/Results: Dg Cervical Spine 1 View  Result Date: 05/21/2019 CLINICAL DATA:  ACDF C3-C7 EXAM: DG C-ARM 61-120 MIN; DG CERVICAL SPINE - 1 VIEW COMPARISON:  MRI 01/07/2019 FINDINGS: Multiple intraoperative spot images demonstrate changes of anterior fusion C3-C7. No hardware complicating feature. Normal alignment. IMPRESSION: C3-C7 ACDF.  No visible complicating feature. Electronically Signed   By: Rolm Baptise M.D.   On: 05/21/2019 17:27   Dg C-arm 1-60 Min  Result Date: 05/21/2019 CLINICAL DATA:  ACDF C3-C7 EXAM: DG C-ARM 61-120 MIN; DG CERVICAL SPINE - 1 VIEW COMPARISON:  MRI 01/07/2019 FINDINGS: Multiple intraoperative spot images demonstrate changes of anterior fusion C3-C7. No hardware complicating feature. Normal alignment. IMPRESSION: C3-C7 ACDF.  No visible complicating feature. Electronically Signed   By: Rolm Baptise M.D.   On: 05/21/2019 17:27    Assessment/Plan: Postop day  1 ACDF C3-C7 doing very well working with physical therapy probable discharge later this morning  LOS: 1 day     Lyndzee Kliebert P 05/22/2019, 8:00 AM

## 2019-05-22 NOTE — Discharge Summary (Signed)
Physician Discharge Summary  Patient ID: Gabriel Atkins MRN: [id number] DOB/AGE: 67/26/1952 67 y.o.  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 The patient tolerated the procedure well and was taken to the recovery room and then to the floor in stable condition. The hospital course was routine. There were no complications. The wound remained clean dry and intact. Pt had appropriate neck soreness. No complaints of arm pain or new N/T/W. The patient remained afebrile with stable vital signs, and tolerated a regular diet. The patient continued to increase activities, and pain was well controlled with oral pain medications.   Consults: None  Significant Diagnostic Studies:  Results for orders placed or performed during the hospital encounter of 05/19/19  Surgical pcr screen   Specimen: Nasal Mucosa; Nasal Swab  Result Value Ref Range   MRSA, PCR NEGATIVE NEGATIVE   Staphylococcus aureus NEGATIVE NEGATIVE  Basic metabolic panel  Result Value Ref Range   Sodium 139 135 - 145 mmol/L   Potassium 4.0 3.5 - 5.1 mmol/L   Chloride 109 98 - 111 mmol/L   CO2 20 (L) 22 - 32 mmol/L   Glucose, Bld 133 (H) 70 - 99 mg/dL   BUN 7 (L) 8 - 23 mg/dL   Creatinine, Ser 1.12 0.61 - 1.24 mg/dL   Calcium 9.6 8.9 - 10.3 mg/dL   GFR calc non Af Amer >60 >60 mL/min   GFR calc Af Amer >60 >60 mL/min   Anion gap 10 5 - 15  CBC  Result Value Ref Range   WBC 8.2 4.0 - 10.5 K/uL   RBC 4.87 4.22 - 5.81 MIL/uL   Hemoglobin 15.8 13.0 - 17.0 g/dL   HCT 48.3 39.0 - 52.0 %   MCV 99.2 80.0 - 100.0 fL   MCH 32.4 26.0 - 34.0 pg   MCHC 32.7 30.0 - 36.0 g/dL   RDW 13.8 11.5 - 15.5 %   Platelets 222 150 - 400  K/uL   nRBC 0.0 0.0 - 0.2 %    Dg Cervical Spine 1 View  Result Date: 05/21/2019 CLINICAL DATA:  ACDF C3-C7 EXAM: DG C-ARM 61-120 MIN; DG CERVICAL SPINE - 1 VIEW COMPARISON:  MRI 01/07/2019 FINDINGS: Multiple intraoperative spot images demonstrate changes of anterior fusion C3-C7. No hardware complicating feature. Normal alignment. IMPRESSION: C3-C7 ACDF.  No visible complicating feature. Electronically Signed   By: Rolm Baptise M.D.   On: 05/21/2019 17:27   Dg C-arm 1-60 Min  Result Date: 05/21/2019 CLINICAL DATA:  ACDF C3-C7 EXAM: DG C-ARM 61-120 MIN; DG CERVICAL SPINE - 1 VIEW COMPARISON:  MRI 01/07/2019 FINDINGS: Multiple intraoperative spot images demonstrate changes of anterior fusion C3-C7. No hardware complicating feature. Normal alignment. IMPRESSION: C3-C7 ACDF.  No visible complicating feature. Electronically Signed   By: Rolm Baptise M.D.   On: 05/21/2019 17:27    Antibiotics:  Anti-infectives (From admission, onward)   Start     Dose/Rate Route Frequency Ordered Stop   05/21/19 2100  ceFAZolin (ANCEF) IVPB 2g/100 mL premix     2 g 200 mL/hr over 30 Minutes Intravenous Every 8 hours 05/21/19 1842 05/22/19 0514   05/21/19 1321  bacitracin 50,000 Units in sodium chloride 0.9 % 500 mL irrigation  Status:  Discontinued  As needed 05/21/19 1321 05/21/19 1655   05/21/19 1000  ceFAZolin (ANCEF) IVPB 2g/100 mL premix     2 g 200 mL/hr over 30 Minutes Intravenous On call to O.R. 05/21/19 0947 05/21/19 1258      Discharge Exam: Blood pressure 112/63, pulse 68, temperature 98.3 F (36.8 C), temperature source Oral, resp. rate 18, height 5\' 9"  (1.753 m), weight 86 kg, SpO2 91 %. Neurologic: Grossly normal Incision cdi  Discharge Medications:   Allergies as of 05/22/2019      Reactions   Ciprofloxacin    Headaches, dizziness, cold sweats    Codeine Nausea And Vomiting   Headaches, dizziness, cold sweats    Morphine And Related    "doesn't work"   Latex Rash       Medication List    TAKE these medications   acetaminophen 500 MG tablet Commonly known as: TYLENOL Take 1,000 mg by mouth every 6 (six) hours as needed for moderate pain or headache.   aspirin 325 MG EC tablet Take 325 mg by mouth daily.   atorvastatin 40 MG tablet Commonly known as: LIPITOR Take 40 mg by mouth at bedtime.   benazepril 20 MG tablet Commonly known as: LOTENSIN Take 20 mg by mouth daily.   BLUE-EMU PAIN RELIEF DRY EX Apply 1 application topically daily as needed (pain).   Centrum Silver Adult 50+ Tabs Take 1 tablet by mouth daily.   diphenhydrAMINE 25 MG tablet Commonly known as: BENADRYL Take 25 mg by mouth at bedtime.   famotidine 20 MG tablet Commonly known as: PEPCID Take 20 mg by mouth at bedtime.   loratadine 10 MG tablet Commonly known as: CLARITIN Take 10 mg by mouth daily as needed for allergies.   methocarbamol 500 MG tablet Commonly known as: Robaxin Take 1 tablet (500 mg total) by mouth 4 (four) times daily.   metoprolol tartrate 25 MG tablet Commonly known as: LOPRESSOR Take 25 mg by mouth 2 (two) times daily.   miconazole 2 % cream Commonly known as: MICOTIN Apply 1 application topically daily.   oxyCODONE-acetaminophen 5-325 MG tablet Commonly known as: Percocet Take 1 tablet by mouth every 4 (four) hours as needed for severe pain.            Durable Medical Equipment  (From admission, onward)         Start     Ordered   05/22/19 0912  For home use only DME Walker rolling  Once    Question:  Patient needs a walker to treat with the following condition  Answer:  Status post spinal surgery   05/22/19 0911          Disposition: home   Final GH:WEXH C3-4, C4-5, C5-6, C6-7    Follow-up Information    [facility] Follow up.   Specialty: Home Health Services Why: Home Health PT/OT arranged [phone]       Kary Kos, MD Follow up in 2 week(s).   Specialty: Neurosurgery Contact  information: [address] [place] [address] [phone]            Signed: Ocie Cornfield [facility] 05/22/2019, 1:39 PM

## 2019-05-22 NOTE — Progress Notes (Signed)
Pt doing well. Pt given D/C instructions with verbal understanding. Pt's incision is clean and dry with no sign of infection. Pt's IV was removed prior to D/C. Home Health was arranged by CM prior to D/C. Pt received RW from [facility] per MD order. Pt is stable @ D/C and has no other needs at this time. Holli Humbles, RN

## 2019-05-23 ENCOUNTER — Encounter (HOSPITAL_COMMUNITY): Payer: Self-pay | Admitting: Neurosurgery

## 2019-07-08 DIAGNOSIS — R911 Solitary pulmonary nodule: Secondary | ICD-10-CM

## 2019-07-08 HISTORY — DX: Solitary pulmonary nodule: R91.1

## 2019-07-19 ENCOUNTER — Inpatient Hospital Stay (HOSPITAL_COMMUNITY)
Admission: EM | Admit: 2019-07-19 | Discharge: 2019-07-22 | DRG: 054 | Disposition: A | Discharge status: Home / Self Care | Payer: Medicare Other | Attending: Internal Medicine | Admitting: Internal Medicine

## 2019-07-19 ENCOUNTER — Emergency Department (HOSPITAL_COMMUNITY): Payer: Medicare Other

## 2019-07-19 ENCOUNTER — Other Ambulatory Visit: Payer: Self-pay

## 2019-07-19 ENCOUNTER — Inpatient Hospital Stay (HOSPITAL_COMMUNITY): Payer: Medicare Other

## 2019-07-19 ENCOUNTER — Encounter (HOSPITAL_COMMUNITY): Payer: Self-pay | Admitting: Emergency Medicine

## 2019-07-19 DIAGNOSIS — K219 Gastro-esophageal reflux disease without esophagitis: Secondary | ICD-10-CM | POA: Diagnosis present

## 2019-07-19 DIAGNOSIS — Z91040 Latex allergy status: Secondary | ICD-10-CM | POA: Diagnosis not present

## 2019-07-19 DIAGNOSIS — G9389 Other specified disorders of brain: Secondary | ICD-10-CM

## 2019-07-19 DIAGNOSIS — E1165 Type 2 diabetes mellitus with hyperglycemia: Secondary | ICD-10-CM | POA: Diagnosis present

## 2019-07-19 DIAGNOSIS — I10 Essential (primary) hypertension: Secondary | ICD-10-CM | POA: Diagnosis present

## 2019-07-19 DIAGNOSIS — Z885 Allergy status to narcotic agent status: Secondary | ICD-10-CM | POA: Diagnosis not present

## 2019-07-19 DIAGNOSIS — Z20828 Contact with and (suspected) exposure to other viral communicable diseases: Secondary | ICD-10-CM | POA: Diagnosis present

## 2019-07-19 DIAGNOSIS — R4701 Aphasia: Secondary | ICD-10-CM | POA: Diagnosis present

## 2019-07-19 DIAGNOSIS — N179 Acute kidney failure, unspecified: Secondary | ICD-10-CM | POA: Diagnosis present

## 2019-07-19 DIAGNOSIS — Z9081 Acquired absence of spleen: Secondary | ICD-10-CM

## 2019-07-19 DIAGNOSIS — W19XXXA Unspecified fall, initial encounter: Secondary | ICD-10-CM

## 2019-07-19 DIAGNOSIS — Z79899 Other long term (current) drug therapy: Secondary | ICD-10-CM

## 2019-07-19 DIAGNOSIS — Z87891 Personal history of nicotine dependence: Secondary | ICD-10-CM | POA: Diagnosis not present

## 2019-07-19 DIAGNOSIS — R911 Solitary pulmonary nodule: Secondary | ICD-10-CM | POA: Diagnosis not present

## 2019-07-19 DIAGNOSIS — Z981 Arthrodesis status: Secondary | ICD-10-CM | POA: Diagnosis not present

## 2019-07-19 DIAGNOSIS — C719 Malignant neoplasm of brain, unspecified: Secondary | ICD-10-CM

## 2019-07-19 DIAGNOSIS — G8384 Todd's paralysis (postepileptic): Secondary | ICD-10-CM | POA: Diagnosis present

## 2019-07-19 DIAGNOSIS — C3412 Malignant neoplasm of upper lobe, left bronchus or lung: Secondary | ICD-10-CM

## 2019-07-19 DIAGNOSIS — W06XXXA Fall from bed, initial encounter: Secondary | ICD-10-CM | POA: Diagnosis present

## 2019-07-19 DIAGNOSIS — G936 Cerebral edema: Secondary | ICD-10-CM | POA: Diagnosis present

## 2019-07-19 DIAGNOSIS — Z881 Allergy status to other antibiotic agents status: Secondary | ICD-10-CM

## 2019-07-19 DIAGNOSIS — R569 Unspecified convulsions: Secondary | ICD-10-CM | POA: Diagnosis present

## 2019-07-19 DIAGNOSIS — G8194 Hemiplegia, unspecified affecting left nondominant side: Secondary | ICD-10-CM | POA: Diagnosis present

## 2019-07-19 DIAGNOSIS — R29810 Facial weakness: Secondary | ICD-10-CM | POA: Diagnosis present

## 2019-07-19 DIAGNOSIS — Z7982 Long term (current) use of aspirin: Secondary | ICD-10-CM | POA: Diagnosis not present

## 2019-07-19 DIAGNOSIS — D496 Neoplasm of unspecified behavior of brain: Principal | ICD-10-CM | POA: Diagnosis present

## 2019-07-19 DIAGNOSIS — IMO0001 Reserved for inherently not codable concepts without codable children: Secondary | ICD-10-CM

## 2019-07-19 DIAGNOSIS — E785 Hyperlipidemia, unspecified: Secondary | ICD-10-CM | POA: Diagnosis present

## 2019-07-19 DIAGNOSIS — M1990 Unspecified osteoarthritis, unspecified site: Secondary | ICD-10-CM | POA: Diagnosis present

## 2019-07-19 DIAGNOSIS — Z808 Family history of malignant neoplasm of other organs or systems: Secondary | ICD-10-CM | POA: Diagnosis not present

## 2019-07-19 DIAGNOSIS — Z8042 Family history of malignant neoplasm of prostate: Secondary | ICD-10-CM | POA: Diagnosis not present

## 2019-07-19 DIAGNOSIS — R918 Other nonspecific abnormal finding of lung field: Secondary | ICD-10-CM | POA: Diagnosis not present

## 2019-07-19 DIAGNOSIS — Y92009 Unspecified place in unspecified non-institutional (private) residence as the place of occurrence of the external cause: Secondary | ICD-10-CM

## 2019-07-19 LAB — DIFFERENTIAL
Abs Immature Granulocytes: 0.04 10*3/uL (ref 0.00–0.07)
Basophils Absolute: 0.1 10*3/uL (ref 0.0–0.1)
Basophils Relative: 1 %
Eosinophils Absolute: 0.2 10*3/uL (ref 0.0–0.5)
Eosinophils Relative: 2 %
Immature Granulocytes: 0 %
Lymphocytes Relative: 45 %
Lymphs Abs: 4.7 10*3/uL — ABNORMAL HIGH (ref 0.7–4.0)
Monocytes Absolute: 1 10*3/uL (ref 0.1–1.0)
Monocytes Relative: 9 %
Neutro Abs: 4.5 10*3/uL (ref 1.7–7.7)
Neutrophils Relative %: 43 %

## 2019-07-19 LAB — COMPREHENSIVE METABOLIC PANEL
ALT: 29 U/L (ref 0–44)
AST: 36 U/L (ref 15–41)
Albumin: 4 g/dL (ref 3.5–5.0)
Alkaline Phosphatase: 82 U/L (ref 38–126)
Anion gap: 17 — ABNORMAL HIGH (ref 5–15)
BUN: 10 mg/dL (ref 8–23)
CO2: 15 mmol/L — ABNORMAL LOW (ref 22–32)
Calcium: 9.1 mg/dL (ref 8.9–10.3)
Chloride: 105 mmol/L (ref 98–111)
Creatinine, Ser: 1.41 mg/dL — ABNORMAL HIGH (ref 0.61–1.24)
GFR calc Af Amer: 59 mL/min — ABNORMAL LOW (ref >60)
GFR calc non Af Amer: 51 mL/min — ABNORMAL LOW (ref >60)
Glucose, Bld: 191 mg/dL — ABNORMAL HIGH (ref 70–99)
Potassium: 3.8 mmol/L (ref 3.5–5.1)
Sodium: 137 mmol/L (ref 135–145)
Total Bilirubin: 0.6 mg/dL (ref 0.3–1.2)
Total Protein: 7.5 g/dL (ref 6.5–8.1)

## 2019-07-19 LAB — URINALYSIS, ROUTINE W REFLEX MICROSCOPIC
Bilirubin Urine: NEGATIVE
Glucose, UA: NEGATIVE mg/dL
Hgb urine dipstick: NEGATIVE
Ketones, ur: NEGATIVE mg/dL
Leukocytes,Ua: NEGATIVE
Nitrite: NEGATIVE
Protein, ur: 100 mg/dL — AB
Specific Gravity, Urine: 1.014 (ref 1.005–1.030)
pH: 5 (ref 5.0–8.0)

## 2019-07-19 LAB — LIPID PANEL
Cholesterol: 142 mg/dL (ref 0–200)
HDL: 44 mg/dL (ref >40)
LDL Cholesterol: 64 mg/dL (ref 0–99)
Total CHOL/HDL Ratio: 3.2 RATIO
Triglycerides: 171 mg/dL — ABNORMAL HIGH (ref <150)
VLDL: 34 mg/dL (ref 0–40)

## 2019-07-19 LAB — TROPONIN I (HIGH SENSITIVITY): Troponin I (High Sensitivity): 11 ng/L (ref <18)

## 2019-07-19 LAB — CBC
HCT: 46.2 % (ref 39.0–52.0)
Hemoglobin: 14.3 g/dL (ref 13.0–17.0)
MCH: 31.4 pg (ref 26.0–34.0)
MCHC: 31 g/dL (ref 30.0–36.0)
MCV: 101.5 fL — ABNORMAL HIGH (ref 80.0–100.0)
Platelets: 225 10*3/uL (ref 150–400)
RBC: 4.55 MIL/uL (ref 4.22–5.81)
RDW: 13.5 % (ref 11.5–15.5)
WBC: 10.5 10*3/uL (ref 4.0–10.5)
nRBC: 0 % (ref 0.0–0.2)

## 2019-07-19 LAB — RAPID URINE DRUG SCREEN, HOSP PERFORMED
Amphetamines: NOT DETECTED
Barbiturates: NOT DETECTED
Benzodiazepines: NOT DETECTED
Cocaine: NOT DETECTED
Opiates: NOT DETECTED
Tetrahydrocannabinol: NOT DETECTED

## 2019-07-19 LAB — SARS CORONAVIRUS 2 BY RT PCR (HOSPITAL ORDER, PERFORMED IN ~~LOC~~ HOSPITAL LAB): SARS Coronavirus 2: NEGATIVE

## 2019-07-19 LAB — PROTIME-INR
INR: 1.3 — ABNORMAL HIGH (ref 0.8–1.2)
Prothrombin Time: 15.8 seconds — ABNORMAL HIGH (ref 11.4–15.2)

## 2019-07-19 LAB — ETHANOL: Alcohol, Ethyl (B): <10 mg/dL (ref <10)

## 2019-07-19 LAB — APTT: aPTT: 31 seconds (ref 24–36)

## 2019-07-19 LAB — CBG MONITORING, ED: Glucose-Capillary: 170 mg/dL — ABNORMAL HIGH (ref 70–99)

## 2019-07-19 MED ORDER — ATORVASTATIN CALCIUM 40 MG PO TABS
40.0000 mg | ORAL_TABLET | Freq: Every day | ORAL | Status: DC
  Administered 2019-07-19 – 2019-07-21 (×3): 40 mg via ORAL
  Filled 2019-07-19 (×3): qty 1

## 2019-07-19 MED ORDER — ONDANSETRON HCL 4 MG/2ML IJ SOLN
4.0000 mg | Freq: Four times a day (QID) | INTRAMUSCULAR | Status: DC | PRN
  Filled 2019-07-19 (×2): qty 2

## 2019-07-19 MED ORDER — ACETAMINOPHEN 500 MG PO TABS: 1000.0000 mg | ORAL_TABLET | Freq: Four times a day (QID) | ORAL | Status: DC | PRN

## 2019-07-19 MED ORDER — ONDANSETRON HCL 4 MG PO TABS
4.0000 mg | ORAL_TABLET | Freq: Four times a day (QID) | ORAL | Status: DC | PRN
  Filled 2019-07-19: qty 1

## 2019-07-19 MED ORDER — ACETAMINOPHEN 325 MG PO TABS
650.0000 mg | ORAL_TABLET | Freq: Four times a day (QID) | ORAL | Status: DC | PRN
  Administered 2019-07-19: 650 mg via ORAL
  Filled 2019-07-19: qty 2

## 2019-07-19 MED ORDER — HEPARIN SODIUM (PORCINE) 5000 UNIT/ML IJ SOLN
5000.0000 [IU] | Freq: Three times a day (TID) | INTRAMUSCULAR | Status: DC
  Administered 2019-07-19 – 2019-07-20 (×3): 5000 [IU] via SUBCUTANEOUS
  Filled 2019-07-19 (×3): qty 1

## 2019-07-19 MED ORDER — CENTRUM SILVER ADULT 50+ PO TABS: 1.0000 | ORAL_TABLET | Freq: Every day | ORAL | Status: DC

## 2019-07-19 MED ORDER — ASPIRIN EC 325 MG PO TBEC
325.0000 mg | DELAYED_RELEASE_TABLET | Freq: Every day | ORAL | Status: DC
  Administered 2019-07-19 – 2019-07-22 (×4): 325 mg via ORAL
  Filled 2019-07-19 (×4): qty 1

## 2019-07-19 MED ORDER — GADOBUTROL 1 MMOL/ML IV SOLN
8.0000 mL | Freq: Once | INTRAVENOUS | Status: AC | PRN
  Administered 2019-07-19: 8 mL via INTRAVENOUS

## 2019-07-19 MED ORDER — DEXAMETHASONE SODIUM PHOSPHATE 10 MG/ML IJ SOLN
6.0000 mg | Freq: Four times a day (QID) | INTRAMUSCULAR | Status: AC
  Administered 2019-07-19: 6 mg via INTRAVENOUS
  Filled 2019-07-19 (×3): qty 0.6

## 2019-07-19 MED ORDER — METHOCARBAMOL 750 MG PO TABS
750.0000 mg | ORAL_TABLET | Freq: Three times a day (TID) | ORAL | Status: DC | PRN
  Administered 2019-07-19 – 2019-07-20 (×2): 750 mg via ORAL
  Filled 2019-07-19 (×3): qty 1

## 2019-07-19 MED ORDER — DEXAMETHASONE SODIUM PHOSPHATE 10 MG/ML IJ SOLN
10.0000 mg | Freq: Once | INTRAMUSCULAR | Status: AC
  Administered 2019-07-19: 11:00:00 10 mg via INTRAVENOUS
  Filled 2019-07-19: qty 1

## 2019-07-19 MED ORDER — ACETAMINOPHEN 650 MG RE SUPP: 650.0000 mg | Freq: Four times a day (QID) | RECTAL | Status: DC | PRN

## 2019-07-19 MED ORDER — LACTATED RINGERS IV SOLN
INTRAVENOUS | Status: AC
  Administered 2019-07-19: 13:00:00 via INTRAVENOUS

## 2019-07-19 MED ORDER — LEVETIRACETAM IN NACL 500 MG/100ML IV SOLN
500.0000 mg | Freq: Two times a day (BID) | INTRAVENOUS | Status: DC
  Administered 2019-07-19 – 2019-07-22 (×7): 500 mg via INTRAVENOUS
  Filled 2019-07-19 (×11): qty 100

## 2019-07-19 MED ORDER — SODIUM CHLORIDE 0.9 % IV BOLUS
1000.0000 mL | Freq: Once | INTRAVENOUS | Status: AC
  Administered 2019-07-19: 1000 mL via INTRAVENOUS

## 2019-07-19 MED ORDER — ADULT MULTIVITAMIN W/MINERALS CH
1.0000 | ORAL_TABLET | Freq: Every day | ORAL | Status: DC
  Administered 2019-07-19 – 2019-07-22 (×4): 1 via ORAL
  Filled 2019-07-19 (×4): qty 1

## 2019-07-19 MED ORDER — LORATADINE 10 MG PO TABS: 10.0000 mg | ORAL_TABLET | Freq: Every day | ORAL | Status: DC | PRN

## 2019-07-19 MED ORDER — FLUTICASONE PROPIONATE 50 MCG/ACT NA SUSP
1.0000 | Freq: Every day | NASAL | Status: DC
  Administered 2019-07-20 – 2019-07-21 (×2): 1 via NASAL
  Filled 2019-07-19: qty 16

## 2019-07-19 MED ORDER — LEVETIRACETAM IN NACL 1000 MG/100ML IV SOLN
1000.0000 mg | Freq: Once | INTRAVENOUS | Status: AC
  Administered 2019-07-19: 1000 mg via INTRAVENOUS
  Filled 2019-07-19: qty 100

## 2019-07-19 MED ORDER — DIPHENHYDRAMINE HCL 25 MG PO CAPS
25.0000 mg | ORAL_CAPSULE | Freq: Every day | ORAL | Status: DC
  Administered 2019-07-19 – 2019-07-21 (×3): 25 mg via ORAL
  Filled 2019-07-19 (×7): qty 1

## 2019-07-19 MED ORDER — DEXAMETHASONE SODIUM PHOSPHATE 4 MG/ML IJ SOLN
4.0000 mg | Freq: Four times a day (QID) | INTRAMUSCULAR | Status: AC
  Administered 2019-07-20 (×4): 4 mg via INTRAVENOUS
  Filled 2019-07-19 (×4): qty 1

## 2019-07-19 MED ORDER — FAMOTIDINE 20 MG PO TABS
20.0000 mg | ORAL_TABLET | Freq: Every day | ORAL | Status: DC
  Administered 2019-07-19 – 2019-07-21 (×3): 20 mg via ORAL
  Filled 2019-07-19 (×3): qty 1

## 2019-07-19 MED ORDER — ONDANSETRON HCL 4 MG/2ML IJ SOLN: 4.0000 mg | INTRAMUSCULAR | Status: DC | PRN

## 2019-07-19 NOTE — Consult Note (Signed)
[facility] TeleNeurology Consult Services   Date of Service:   07/19/2019 10:05:00  Impression:     .  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     .       .  CT head is suggestive of underlying mass lesion in my opinion, as is presentation.  Comments/Sign-Out: TPA is contraindicated. Presentation not suggestive of LVO. ------------------------------------------------------------------  Metrics: [facility] Notification Time: 07/19/2019 10:05:00 Arrival Time: 07/19/2019 09:42:00 Stamp Time: 07/19/2019 10:05:00 Time First Login Attempt: 07/19/2019 10:08:00 Video Start Time: 07/19/2019 10:14:21  Symptoms: left side weakness, aphasia NIHSS Start Assessment Time: 07/19/2019 10:20:20 Patient is not a candidate for Alteplase/Activase. 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 Video End Time: 07/19/2019 10:32:07  CT head was reviewed and results were: right frontal vasogenic edema concerning for underlying mass vs. Subacute infarct per radiologist  Clinical Presentation is not Suggestive of Large Vessel Occlusive Disease  ED Physician notified of diagnostic impression and management plan on 07/19/2019 10:32:10  Our recommendations are outlined below.  Recommendations:     .  keppra 1gm IV load stat     .  requires MRI brain with and without contrast STAT, neurosurgery consult; per ED physician, this requires transfer to another facility and she will arrange this  Routine Consultation with [facility] for Follow up Care  Sign Out:     .  Discussed with Emergency Department Provider    ------------------------------------------------------------------------------  History of Present Illness: Patient is a 68 year old Male.  Patient  was brought by EMS for symptoms of left side weakness, aphasia  (per EMS report to ED) wife noted patient was at baseline when he got up to get coffee at 8:00 and was heard to have a fall, with left side weakness/aphasia. He has had significant improvement in ED but now has nausea and right anterior neck pain.   Patient recalls jerking in left arm that generalized shaking in his room...then he next recalls paramedics arriving. He states he was at baseline at 8:00. He denies prior hx of seizure, stroke, CNS infection. He denies speech/language impairment, visual field loss, double vision, vertigo; he notes only mild right LE weakness (but no issue with left side) but cannot report time of onset of this.   He is right handed. He is on aspirin empirically. He has hx of tobacco use. Had cervical fusion surgery 05/21/2019 for left UE sensory sxs; his sxs did not improve after this surgery.   Past Medical History:     . Hypertension  Anticoagulant use:  No  Antiplatelet use:   Examination: BP(initial 91/58 then 99/67), Blood Glucose(170) 1A: Level of Consciousness - Alert; keenly responsive + 0 1B: Ask Month and Age - Both Questions Right + 0 1C: Blink Eyes & Squeeze Hands - Performs Both Tasks + 0 2: Test Horizontal Extraocular Movements - Normal + 0 3: Test Visual Fields - No Visual Loss + 0 4: Test Facial Palsy (Use Grimace if Obtunded) - Normal symmetry + 0 5A: Test Left Arm Motor Drift - No Drift for 10 Seconds + 0 5B: Test Right Arm Motor Drift - No Drift for 10 Seconds + 0 6A: Test Left Leg Motor Drift - No Drift for 5 Seconds + 0 6B: Test Right Leg Motor Drift - No Drift for 5 Seconds + 0 7: Test Limb Ataxia (FNF/Heel-Shin) -  No Ataxia + 0 8: Test Sensation - Normal; No sensory loss + 0 9: Test Language/Aphasia - Normal; No aphasia + 0 10: Test Dysarthria - Normal + 0 11: Test Extinction/Inattention - No abnormality + 0  NIHSS Score: 0  Patient/Family was informed the Neurology  Consult would happen via TeleHealth consult by way of interactive audio and video telecommunications and consented to receiving care in this manner.   Due to the immediate potential for life-threatening deterioration due to underlying acute neurologic illness, I spent 20 minutes providing critical care. This time includes time for face to face visit via telemedicine, review of medical records, imaging studies and discussion of findings with providers, the patient and/or family.   Dr Lillia Carmel   [facility] [phone]   Case [id number]

## 2019-07-19 NOTE — Progress Notes (Signed)
CODE STROKE 0915 1 st call 0925 2nd call 0934 beeper time [phone] exam started 0950 exam finished 0952 images to [facility] Epic,called GR, spoke with Marzetta Board

## 2019-07-19 NOTE — ED Notes (Signed)
Pt deemed not TPA candidate due to CT results and recent surgery.

## 2019-07-19 NOTE — ED Notes (Signed)
Pt placed on 2L as O2 sat drops to 88% at times. Variable between 88-94%.

## 2019-07-19 NOTE — ED Provider Notes (Signed)
[facility] EMERGENCY DEPARTMENT Provider Note   CSN: [id number] Arrival date & time: 07/19/19  7035  An emergency department physician performed an initial assessment on this suspected stroke patient at 0946.  History   Chief Complaint Chief Complaint  Patient presents with   Code Stroke    HPI Gabriel Atkins is a 68 y.o. male.     The history is provided by the EMS personnel, the patient and the spouse. The history is limited by the condition of the patient (acuity of condition).  Pt was seen at 0945. Per EMS, pt's wife and pt: Pt's wife states she saw pt LKW at 0800, fixing coffee. Pt walked into another room, wife heard fall. Pt was found wedged between wall and furniture. EMS states on their arrival to scene, pt had left sided facial droop, LUE and LLE weakness. EMS states pt was minimally conversive en route, but became more so by arrival to ED, and his left sided weakness slowly improved.  Pt then recalled his "left arm was jerking" before he "doesn't remember anything." Denies CP/SOB, no abd pain, no N/V/D, no fevers.     Past Medical History:  Diagnosis Date   Arthritis    GERD (gastroesophageal reflux disease)    History of hiatal hernia    Hypertension     Patient Active Problem List   Diagnosis Date Noted   Seizure ([place]) 07/19/2019   Myelopathy ([place]) 05/21/2019    Past Surgical History:  Procedure Laterality Date   ANTERIOR CERVICAL DECOMPRESSION/DISCECTOMY FUSION 4 LEVELS N/A 05/21/2019   Procedure: ANTERIOR CERVICAL DECOMPRESSION/DISCECTOMY FUSION - CERVICAL THREE-FOUR, CERVICAL FOUR-FIVE, CERVICAL FIVE-SIX, CERVICAL SIX-SEVEN;  Surgeon: Kary Kos, MD;  Location: [place];  Service: Neurosurgery;  Laterality: N/A;   CYST EXCISION     Left Groin   SPLENECTOMY, TOTAL          Home Medications    Prior to Admission medications   Medication Sig Start Date End Date Taking? Authorizing Provider  acetaminophen (TYLENOL) 500 MG tablet Take 1,000 mg  by mouth every 6 (six) hours as needed for moderate pain or headache.   Yes [provider]  amitriptyline (ELAVIL) 10 MG tablet Take 10 mg by mouth at bedtime as needed for sleep.   Yes [provider]  aspirin 325 MG EC tablet Take 325 mg by mouth daily.   Yes [provider]  atorvastatin (LIPITOR) 40 MG tablet Take 40 mg by mouth at bedtime.   Yes [provider]  benazepril (LOTENSIN) 20 MG tablet Take 20 mg by mouth daily.   Yes [provider]  diphenhydrAMINE (BENADRYL) 25 MG tablet Take 25 mg by mouth at bedtime.   Yes [provider]  famotidine (PEPCID) 20 MG tablet Take 20 mg by mouth at bedtime.   Yes [provider]  Lidocaine (BLUE-EMU PAIN RELIEF DRY EX) Apply 1 application topically daily as needed (pain).   Yes [provider]  loratadine (CLARITIN) 10 MG tablet Take 10 mg by mouth daily as needed for allergies.   Yes [provider]  methocarbamol (ROBAXIN) 750 MG tablet Take 1 tablet by mouth 3 (three) times daily as needed. 07/10/19  Yes [provider]  metoprolol tartrate (LOPRESSOR) 25 MG tablet Take 25 mg by mouth 2 (two) times daily.   Yes [provider]  miconazole (MICOTIN) 2 % cream Apply 1 application topically daily as needed.    Yes [provider]  mometasone (NASONEX) 50 MCG/ACT nasal  spray Place 1-2 sprays into the nose daily as needed.   Yes [provider]  Multiple Vitamins-Minerals (CENTRUM SILVER ADULT 50+) TABS Take 1 tablet by mouth daily.   Yes [provider]  promethazine (PHENERGAN) 12.5 MG tablet Take 1 tablet by mouth 3 (three) times daily as needed. 07/08/19  Yes [provider]  traMADol (ULTRAM) 50 MG tablet Take 1 tablet by mouth 4 (four) times daily as needed. 05/27/19  Yes [provider]    Family History History reviewed. No pertinent family history.  Social History Social History   Tobacco Use    Smoking status: Former Smoker    Packs/day: 2.00   Smokeless tobacco: Never Used  Substance Use Topics   Alcohol use: Not Currently   Drug use: Never     Allergies   Ciprofloxacin, Codeine, Morphine and related, Latex, Oxycodone, and Pentazocine   Review of Systems Review of Systems  Unable to perform ROS: Acuity of condition     Physical Exam Updated Vital Signs BP 116/72    Pulse 96    Temp 97.8 F (36.6 C) (Oral)    Resp (!) 26    Ht 5\' 9"  (1.753 m)    Wt 83.9 kg    SpO2 96%    BMI 27.32 kg/m    Patient Vitals for the past 24 hrs:  BP Temp Temp src Pulse Resp SpO2 Height Weight  07/19/19 1100 116/72 -- -- 96 (!) 26 96 % -- --  07/19/19 1045 114/71 -- -- 98 (!) 22 93 % -- --  07/19/19 1030 111/73 -- -- (!) 106 (!) 22 91 % -- --  07/19/19 1013 -- -- -- -- -- -- 5\' 9"  (1.753 m) 83.9 kg  07/19/19 1012 99/67 97.8 F (36.6 C) Oral 96 18 95 % -- --  07/19/19 1009 99/67 -- -- 97 18 92 % -- --     Physical Exam 0945: Physical examination:  Nursing notes reviewed; Vital signs and O2 SAT reviewed;  Constitutional: Well developed, Well nourished, Well hydrated, In no acute distress; Head:  Normocephalic, atraumatic; Eyes: EOMI, PERRL, No scleral icterus; ENMT: Mouth and pharynx normal, Mucous membranes moist; Neck: Aspen collar in place. Trachea midline.; Cardiovascular: Regular rate and rhythm, No gallop; Respiratory: Breath sounds clear & equal bilaterally, No wheezes.  Speaking full sentences with ease, Normal respiratory effort/excursion; Chest: Nontender, Movement normal; Abdomen: Soft, Nontender, Nondistended, Normal bowel sounds; Genitourinary: No CVA tenderness;  Extremities: Peripheral pulses normal, No tenderness, No edema, No calf edema or asymmetry.; Neuro: Awake, alert. No facial droop. Speech slow but clear. Left < right grip. LUE and LLE strength 4/5; RUE and RLE strength 5/5..; Skin: Color normal, Warm, Dry.     ED Treatments / Results  Labs (all labs ordered  are listed, but only abnormal results are displayed)   EKG EKG Interpretation  Date/Time:  Saturday July 19 2019 10:11:18 EDT Ventricular Rate:  97 PR Interval:    QRS Duration: 94 QT Interval:  368 QTC Calculation: 468 R Axis:   30 Text Interpretation:  Sinus rhythm Abnormal R-wave progression, early transition Artifact When compared with ECG of 05/19/2019 No significant change was found Confirmed by Francine Graven [phone]) on 07/19/2019 11:34:38 AM   Radiology   Procedures Procedures (including critical care time)  Medications Ordered in ED Medications  sodium chloride 0.9 % bolus 1,000 mL (1,000 mLs Intravenous New Bag/Given 07/19/19 1029)  levETIRAcetam (KEPPRA) IVPB 1000 mg/100 mL premix (1,000 mg Intravenous New Bag/Given  07/19/19 1100)  dexamethasone (DECADRON) injection 10 mg (10 mg Intravenous Given 07/19/19 1057)     Initial Impression / Assessment and Plan / ED Course  I have reviewed the triage vital signs and the nursing notes.  Pertinent labs & imaging results that were available during my care of the patient were reviewed by me and considered in my medical decision making (see chart for details).    MDM Reviewed: previous chart, nursing note and vitals Reviewed previous: labs and ECG Interpretation: labs, ECG and CT scan Total time providing critical care: 30-74 minutes. This excludes time spent performing separately reportable procedures and services. Consults: admitting MD, neurosurgery and neurology    CRITICAL CARE Performed by: Francine Graven Total critical care time: 45 minutes Critical care time was exclusive of separately billable procedures and treating other patients. Critical care was necessary to treat or prevent imminent or life-threatening deterioration. Critical care was time spent personally by me on the following activities: development of treatment plan with patient and/or surrogate as well as nursing, discussions with  consultants, evaluation of patient's response to treatment, examination of patient, obtaining history from patient or surrogate, ordering and performing treatments and interventions, ordering and review of laboratory studies, ordering and review of radiographic studies, pulse oximetry and re-evaluation of patient's condition.   Results for orders placed or performed during the hospital encounter of 07/19/19  Protime-INR  Result Value Ref Range   Prothrombin Time 15.8 (H) 11.4 - 15.2 seconds   INR 1.3 (H) 0.8 - 1.2  APTT  Result Value Ref Range   aPTT 31 24 - 36 seconds  CBC  Result Value Ref Range   WBC 10.5 4.0 - 10.5 K/uL   RBC 4.55 4.22 - 5.81 MIL/uL   Hemoglobin 14.3 13.0 - 17.0 g/dL   HCT 46.2 39.0 - 52.0 %   MCV 101.5 (H) 80.0 - 100.0 fL   MCH 31.4 26.0 - 34.0 pg   MCHC 31.0 30.0 - 36.0 g/dL   RDW 13.5 11.5 - 15.5 %   Platelets 225 150 - 400 K/uL   nRBC 0.0 0.0 - 0.2 %  Differential  Result Value Ref Range   Neutrophils Relative % 43 %   Neutro Abs 4.5 1.7 - 7.7 K/uL   Lymphocytes Relative 45 %   Lymphs Abs 4.7 (H) 0.7 - 4.0 K/uL   Monocytes Relative 9 %   Monocytes Absolute 1.0 0.1 - 1.0 K/uL   Eosinophils Relative 2 %   Eosinophils Absolute 0.2 0.0 - 0.5 K/uL   Basophils Relative 1 %   Basophils Absolute 0.1 0.0 - 0.1 K/uL   Immature Granulocytes 0 %   Abs Immature Granulocytes 0.04 0.00 - 0.07 K/uL  Comprehensive metabolic panel  Result Value Ref Range   Sodium 137 135 - 145 mmol/L   Potassium 3.8 3.5 - 5.1 mmol/L   Chloride 105 98 - 111 mmol/L   CO2 15 (L) 22 - 32 mmol/L   Glucose, Bld 191 (H) 70 - 99 mg/dL   BUN 10 8 - 23 mg/dL   Creatinine, Ser 1.41 (H) 0.61 - 1.24 mg/dL   Calcium 9.1 8.9 - 10.3 mg/dL   Total Protein 7.5 6.5 - 8.1 g/dL   Albumin 4.0 3.5 - 5.0 g/dL   AST 36 15 - 41 U/L   ALT 29 0 - 44 U/L   Alkaline Phosphatase 82 38 - 126 U/L   Total Bilirubin 0.6 0.3 - 1.2 mg/dL   GFR calc non Af Wyvonnia Lora  51 (L) >60 mL/min   GFR calc Af Amer 59 (L) >60  mL/min   Anion gap 17 (H) 5 - 15  Ethanol  Result Value Ref Range   Alcohol, Ethyl (B) <10 <10 mg/dL  Urine rapid drug screen (hosp performed)  Result Value Ref Range   Opiates NONE DETECTED NONE DETECTED   Cocaine NONE DETECTED NONE DETECTED   Benzodiazepines NONE DETECTED NONE DETECTED   Amphetamines NONE DETECTED NONE DETECTED   Tetrahydrocannabinol NONE DETECTED NONE DETECTED   Barbiturates NONE DETECTED NONE DETECTED  Urinalysis, Routine w reflex microscopic  Result Value Ref Range   Color, Urine YELLOW YELLOW   APPearance HAZY (A) CLEAR   Specific Gravity, Urine 1.014 1.005 - 1.030   pH 5.0 5.0 - 8.0   Glucose, UA NEGATIVE NEGATIVE mg/dL   Hgb urine dipstick NEGATIVE NEGATIVE   Bilirubin Urine NEGATIVE NEGATIVE   Ketones, ur NEGATIVE NEGATIVE mg/dL   Protein, ur 100 (A) NEGATIVE mg/dL   Nitrite NEGATIVE NEGATIVE   Leukocytes,Ua NEGATIVE NEGATIVE   RBC / HPF 6-10 0 - 5 RBC/hpf   WBC, UA 0-5 0 - 5 WBC/hpf   Bacteria, UA RARE (A) NONE SEEN   Squamous Epithelial / LPF 0-5 0 - 5   Mucus PRESENT    Hyaline Casts, UA PRESENT   CBG monitoring, ED  Result Value Ref Range   Glucose-Capillary 170 (H) 70 - 99 mg/dL  Troponin I (High Sensitivity)  Result Value Ref Range   Troponin I (High Sensitivity) 11 <18 ng/L   Ct Cervical Spine Wo Contrast Result Date: 07/19/2019 CLINICAL DATA:  Patient found beside bed with left-sided weakness. EXAM: CT CERVICAL SPINE WITHOUT CONTRAST TECHNIQUE: Multidetector CT imaging of the cervical spine was performed without intravenous contrast. Multiplanar CT image reconstructions were also generated. COMPARISON:  MRI 01/07/2019 FINDINGS: Alignment: Normal. Skull base and vertebrae: Anterior fusion hardware intact from C3-C7. Vertebral body heights are normal. There is mild spondylosis throughout the cervical spine. There is moderate uncovertebral joint spurring. Atlantoaxial articulation is normal. There is significant multilevel bilateral neural  foraminal narrowing most prominent from the C4-5 to the C6-7 levels. No acute fracture or traumatic subluxation. Soft tissues and spinal canal: No prevertebral fluid or swelling. No visible canal hematoma. Disc levels:  Interbody fusion from C3-C7. Upper chest: No acute findings. Other: None. IMPRESSION: No acute findings. Mild spondylosis throughout the cervical spine. Significant bilateral neural foraminal narrowing at multiple levels as described. Anterior fusion hardware intact from C3-C7. Electronically Signed   By: Marin Olp M.D.   On: 07/19/2019 10:29   Ct Head Code Stroke Wo Contrast Result Date: 07/19/2019 CLINICAL DATA:  Code stroke. Episode of arm shaking and weakness today. No symptoms prior to today. EXAM: CT HEAD WITHOUT CONTRAST TECHNIQUE: Contiguous axial images were obtained from the base of the skull through the vertex without intravenous contrast. COMPARISON:  None. FINDINGS: Brain: Masslike abnormality with high-density/preserved cortex in the right frontal and parietal region with midline shift measuring 8 mm. Although subacute infarct with petechial hemorrhage is considered there were no symptoms yesterday such that mass with vasogenic edema and seizure is most likely. No hematoma or hydrocephalus. Vascular: No hyperdense vessel. Skull: Negative Sinuses/Orbits: Negative Other: These results were called by telephone at the time of interpretation on 07/19/2019 at 10:13 am to provider [facility] , who verbally acknowledged these results. ASPECTS [facility] Stroke Program Early CT Score) Not scored in this setting IMPRESSION: Masslike abnormality centered along the high right frontal parietal  lobe with vasogenic edema and 8 mm midline shift. Recommend brain MRI with contrast. Electronically Signed   By: Monte Fantasia M.D.   On: 07/19/2019 10:14    DAETON KLUTH was evaluated in Emergency Department on 07/19/2019 for the symptoms described in the history of present illness. He was  evaluated in the context of the global COVID-19 pandemic, which necessitated consideration that the patient might be at risk for infection with the SARS-CoV-2 virus that causes COVID-19. Institutional protocols and algorithms that pertain to the evaluation of patients at risk for COVID-19 are in a state of rapid change based on information released by regulatory bodies including the CDC and federal and state organizations. These policies and algorithms were followed during the patient's care in the ED.   1030:  As pt has been in the ED, he has become more and more alert, talkative, moving left extremities. Pt recalled LUE "shaking" and then he "doesn't remember anything."  CT as above. T/C returned from [facility] Dr. Harriette Bouillon has evaluated pt in the ED: states pt likely had focal seizure due to brain mass, load IV keppra 1gm now, call NeuroSurgery for consult/heads up as pt will need MRI brain w/wo and transfer to higher level of care.  1040:  T/C returned from [facility] Dr. Vertell Limber, case discussed, including:  HPI, pertinent PM/SHx, VS/PE, dx testing, ED course and treatment:  He has viewed the images, agrees with Neuro MD regarding need for MRI, states pt will need IV decadron 10mg  now, then 6mg  q6h x2 doses, then decrease to 4mg  q6h, transfer/admit to [facility] under [facility] service and NeuroSurgery will consult.   1050:  Dx and testing, as well as d/w Neuro MD and NeuroSurgeon, d/w pt.  Questions answered.  Verb understanding, agreeable to admit. T/C returned from [facility] Dr. Manuella Ghazi, case discussed, including:  HPI, pertinent PM/SHx, VS/PE, dx testing, ED course and treatment, as well as d/w Neuro MD and NeuroSurgeon:  Agreeable to admit/transfer to [facility]      Final Clinical Impressions(s) / ED Diagnoses   Final diagnoses:  Brain mass  New onset seizure [facility])  Fall in home, initial encounter    ED Discharge Orders    None       Francine Graven, DO 07/22/19 1045

## 2019-07-19 NOTE — Evaluation (Signed)
Physical Therapy Evaluation Patient Details Name: Gabriel Atkins MRN: [id number] DOB: Apr 01, 1951 Today's Date: 07/19/2019   History of Present Illness  Gabriel Atkins is a 68 y.o. male with medical history significant for arthritis, GERD, hypertension, and recent anterior cervical decompression/discectomy-fusion at 4 levels from C3-C7 on 05/21/2019, who was at his usual baseline when he awoke this morning to get coffee at 8 AM.  His wife then heard a fall with some left-sided weakness and aphasia. Wife noted generalized shaking. CT of head was positive for abnormal mass frontoparietal region with midline shift.   Clinical Impression  Pt admitted with above diagnosis. Pt mobilizing well despite recent events. Ambulated 350' without AD, min-guard A. Continues to have some burning in L face, hand, and foot though this has been present since before surgery. Pt recently finished HHPT, would not recommend PT f/u after d/c for this admission. Will follow acutely.  Pt currently with functional limitations due to the deficits listed below (see PT Problem List). Pt will benefit from skilled PT to increase their independence and safety with mobility to allow discharge to the venue listed below.       Follow Up Recommendations No PT follow up    Equipment Recommendations  None recommended by PT    Recommendations for Other Services       Precautions / Restrictions Precautions Precautions: Fall;Cervical Precaution Booklet Issued: No Precaution Comments: cervical fusion 7/20 Required Braces or Orthoses: Cervical Brace Cervical Brace: Hard collar Restrictions Weight Bearing Restrictions: No      Mobility  Bed Mobility Overal bed mobility: Modified Independent             General bed mobility comments: needed increased time and HOB was elevated but no physical assist needed  Transfers Overall transfer level: Needs assistance   Transfers: Sit to/from Stand Sit to Stand: Min guard          General transfer comment: stood safely from bed  Ambulation/Gait Ambulation/Gait assistance: Min guard Gait Distance (Feet): 350 Feet Assistive device: None Gait Pattern/deviations: Decreased stride length;Decreased weight shift to right;Decreased weight shift to left Gait velocity: decreased Gait velocity interpretation: 1.31 - 2.62 ft/sec, indicative of limited community ambulator General Gait Details: guarded gait, affected by cx collar, no LOB  Stairs            Wheelchair Mobility    Modified Rankin (Stroke Patients Only)       Balance Overall balance assessment: Needs assistance Sitting-balance support: Feet supported;No upper extremity supported Sitting balance-Leahy Scale: Good     Standing balance support: No upper extremity supported Standing balance-Leahy Scale: Fair Standing balance comment: pt moves slowly, increased reaction time noted                             Pertinent Vitals/Pain Pain Assessment: Faces Faces Pain Scale: Hurts a little bit Pain Location: L hand Pain Descriptors / Indicators: Burning Pain Intervention(s): Limited activity within patient's tolerance;Monitored during session    Home Living Family/patient expects to be discharged to:: Private residence Living Arrangements: Spouse/significant other Available Help at Discharge: Family;Neighbor Type of Home: House Home Access: Stairs to enter   [employer] of Steps: 4 Home Layout: One level Home Equipment: Shower seat;Toilet riser Additional Comments: pt's wife disabled, uses rollator for RA. Neighbor drives them places and brings food if needed or they do grocery delivery    Prior Function Level of Independence: Independent  Comments: has been moving well since ACDF but has continued to have burning and tingling in L hand, face, and foot     Hand Dominance   Dominant Hand: Right    Extremity/Trunk Assessment   Upper Extremity  Assessment Upper Extremity Assessment: Defer to OT evaluation    Lower Extremity Assessment Lower Extremity Assessment: Overall WFL for tasks assessed    Cervical / Trunk Assessment Cervical / Trunk Assessment: Normal  Communication   Communication: No difficulties  Cognition Arousal/Alertness: Awake/alert Behavior During Therapy: WFL for tasks assessed/performed Overall Cognitive Status: Within Functional Limits for tasks assessed                                        General Comments General comments (skin integrity, edema, etc.): pt recently finished HHPT after surgery    Exercises     Assessment/Plan    PT Assessment Patient needs continued PT services  PT Problem List Decreased mobility;Decreased coordination;Decreased balance;Pain;Impaired sensation       PT Treatment Interventions DME instruction;Gait training;Stair training;Functional mobility training;Therapeutic activities;Therapeutic exercise;Balance training;Neuromuscular re-education;Patient/family education    PT Goals (Current goals can be found in the Care Plan section)  Acute Rehab PT Goals Patient Stated Goal: return home PT Goal Formulation: With patient Time For Goal Achievement: 08/02/19 Potential to Achieve Goals: Good    Frequency Min 3X/week   Barriers to discharge Decreased caregiver support      Co-evaluation               AM-PAC PT "6 Clicks" Mobility  Outcome Measure Help needed turning from your back to your side while in a flat bed without using bedrails?: None Help needed moving from lying on your back to sitting on the side of a flat bed without using bedrails?: None Help needed moving to and from a bed to a chair (including a wheelchair)?: A Little Help needed standing up from a chair using your arms (e.g., wheelchair or bedside chair)?: A Little Help needed to walk in hospital room?: A Little Help needed climbing 3-5 steps with a railing? : A Little 6  Click Score: 20    End of Session Equipment Utilized During Treatment: Gait belt Activity Tolerance: Patient tolerated treatment well Patient left: in bed;with call bell/phone within reach;with bed alarm set Nurse Communication: Mobility status PT Visit Diagnosis: Pain;Unsteadiness on feet (R26.81) Pain - Right/Left: Left Pain - part of body: Hand;Ankle and joints of foot    Time: 1245-8099 PT Time Calculation (min) (ACUTE ONLY): 20 min   Charges:   PT Evaluation $PT Eval Moderate Complexity: [place], PT  Acute Rehab Services  Pager [phone] Office [place] 07/19/2019, 4:17 PM

## 2019-07-19 NOTE — ED Notes (Signed)
Pt's wife was finally able to be contacted. States she will try to find someone to bring him.

## 2019-07-19 NOTE — Consult Note (Addendum)
NEURO HOSPITALIST CONSULT NOTE   Requestig physician: Dr. Cathlean Sauer   Reason for Consult:seizure   History obtained from:  Patient   / chart  HPI:                                                                                                                                          Gabriel Atkins is an 68 y.o. male with PMH HTN who presented to [facility] with  Left sided weakness and aphasia. transferred to [facility] for further neurological evaluation.  Patient got up this morning about 0800 for coffee and was normal. Per patient he remembers having a "spell" his left arm started jerking so he sat down on the bed, and then his whole body started shaking and jerking. It was shaking and jerking so hard that he fell off the bed. EMS was called. He then doesn't remember anything after falling until paramedics arrived.  No prior seizure history. Denies any HA, visual changes   Hospital course:  9/12 seen at [facility] with 1G keppra and dexamethasone,   Was seen by tele neurology at [facility] for left side weakness and aphasia.  Deneise Lever penn CT head was reviewed and results were:Masslike abnormality centered along the high right frontal parietal lobe with vasogenic edema and 8 mm midline shift  Past Medical History:  Diagnosis Date  . Arthritis   . GERD (gastroesophageal reflux disease)   . History of hiatal hernia   . Hypertension     Past Surgical History:  Procedure Laterality Date  . ANTERIOR CERVICAL DECOMPRESSION/DISCECTOMY FUSION 4 LEVELS N/A 05/21/2019   Procedure: ANTERIOR CERVICAL DECOMPRESSION/DISCECTOMY FUSION - CERVICAL THREE-FOUR, CERVICAL FOUR-FIVE, CERVICAL FIVE-SIX, CERVICAL SIX-SEVEN;  Surgeon: Kary Kos, MD;  Location: [place];  Service: Neurosurgery;  Laterality: N/A;  . CYST EXCISION     Left Groin  . SPLENECTOMY, TOTAL      History reviewed. No pertinent family history.              Social History:  reports that he has quit smoking. He smoked 2.00  packs per day. He has never used smokeless tobacco. He reports previous alcohol use. He reports that he does not use drugs.  Allergies  Allergen Reactions  . Ciprofloxacin     Headaches, dizziness, cold sweats    . Codeine Nausea And Vomiting    Headaches, dizziness, cold sweats    . Morphine And Related     "doesn't work"  . Latex Rash  . Oxycodone Nausea Only  . Pentazocine Nausea And Vomiting    MEDICATIONS:  Scheduled: . aspirin  325 mg Oral Daily  . atorvastatin  40 mg Oral QHS  . [START ON 07/20/2019] dexamethasone (DECADRON) injection  4 mg Intravenous Q6H  . dexamethasone (DECADRON) injection  6 mg Intravenous Q6H  . diphenhydrAMINE  25 mg Oral QHS  . famotidine  20 mg Oral QHS  . fluticasone  1 spray Each Nare Daily  . heparin  5,000 Units Subcutaneous Q8H  . multivitamin with minerals  1 tablet Oral Daily   Continuous: . lactated ringers 100 mL/hr at 07/19/19 1240  . levETIRAcetam Stopped (07/19/19 1259)   NWG:[id number] **OR** acetaminophen, loratadine, methocarbamol, ondansetron **OR** ondansetron (ZOFRAN) IV, ondansetron   ROS:                                                                                                                                       ROS was performed and is negative except as noted in HPI    Blood pressure 117/75, pulse 97, temperature 98.4 F (36.9 C), temperature source Oral, resp. rate 16, height 5\' 9"  (1.753 m), weight 83.9 kg, SpO2 95 %.   General Examination:                                                                                                       Physical Exam  HEENT-  Normocephalic, no lesions, without obvious abnormality.  Normal external eye and conjunctiva.   Cardiovascular- S1-S2 audible, pulses palpable throughout   Lungs-no rhonchi or wheezing noted, no excessive working breathing.   Saturations within normal limits Abdomen- All 4 quadrants palpated and nontender Extremities- Warm, dry and intact Musculoskeletal-no joint tenderness, deformity or swelling Skin-warm and dry, no hyperpigmentation, vitiligo, or suspicious lesions  Neurological Examination Mental Status: Alert, oriented, thought content appropriate.  Speech fluent without evidence of aphasia.  Able to follow  without difficulty. Cranial Nerves: II: ; Visual fields grossly normal,  III,IV, VI: ptosis not present, extra-ocular motions intact bilaterally pupils equal, round, reactive to light and accommodation V,VII: smile symmetric, facial light touch sensation normal bilaterally VIII: hearing normal bilaterally IX,X: uvula rises symmetrically XI: bilateral shoulder shrug XII: midline tongue extension Motor: Right : Upper extremity   5/5    Left:     Upper extremity   5/5  Lower extremity   5/5     Lower extremity   5/5 Tone and bulk:normal tone throughout; no atrophy noted Sensory:  light touch intact throughout, bilaterally; patient neglects LLE with DSS. Deep Tendon Reflexes: 2+  and symmetric throughout Plantars: Right: downgoing   Left: downgoing Cerebellar: normal finger-to-nose,  normal heel-to-shin test Gait: deferred   Lab Results: Basic Metabolic Panel: Recent Labs  Lab 07/19/19 0950  NA 137  K 3.8  CL 105  CO2 15*  GLUCOSE 191*  BUN 10  CREATININE 1.41*  CALCIUM 9.1    CBC: Recent Labs  Lab 07/19/19 0950  WBC 10.5  NEUTROABS 4.5  HGB 14.3  HCT 46.2  MCV 101.5*  PLT 225    Lipid Panel: Recent Labs  Lab 07/19/19 0950  CHOL 142  TRIG 171*  HDL 44  CHOLHDL 3.2  VLDL 34  LDLCALC 64    Imaging: Ct Cervical Spine Wo Contrast  Result Date: 07/19/2019 CLINICAL DATA:  Patient found beside bed with left-sided weakness. EXAM: CT CERVICAL SPINE WITHOUT CONTRAST TECHNIQUE: Multidetector CT imaging of the cervical spine was performed without intravenous contrast.  Multiplanar CT image reconstructions were also generated. COMPARISON:  MRI 01/07/2019 FINDINGS: Alignment: Normal. Skull base and vertebrae: Anterior fusion hardware intact from C3-C7. Vertebral body heights are normal. There is mild spondylosis throughout the cervical spine. There is moderate uncovertebral joint spurring. Atlantoaxial articulation is normal. There is significant multilevel bilateral neural foraminal narrowing most prominent from the C4-5 to the C6-7 levels. No acute fracture or traumatic subluxation. Soft tissues and spinal canal: No prevertebral fluid or swelling. No visible canal hematoma. Disc levels:  Interbody fusion from C3-C7. Upper chest: No acute findings. Other: None. IMPRESSION: No acute findings. Mild spondylosis throughout the cervical spine. Significant bilateral neural foraminal narrowing at multiple levels as described. Anterior fusion hardware intact from C3-C7. Electronically Signed   By: Marin Olp M.D.   On: 07/19/2019 10:29   Ct Head Code Stroke Wo Contrast  Result Date: 07/19/2019 CLINICAL DATA:  Code stroke. Episode of arm shaking and weakness today. No symptoms prior to today. EXAM: CT HEAD WITHOUT CONTRAST TECHNIQUE: Contiguous axial images were obtained from the base of the skull through the vertex without intravenous contrast. COMPARISON:  None. FINDINGS: Brain: Masslike abnormality with high-density/preserved cortex in the right frontal and parietal region with midline shift measuring 8 mm. Although subacute infarct with petechial hemorrhage is considered there were no symptoms yesterday such that mass with vasogenic edema and seizure is most likely. No hematoma or hydrocephalus. Vascular: No hyperdense vessel. Skull: Negative Sinuses/Orbits: Negative Other: These results were called by telephone at the time of interpretation on 07/19/2019 at 10:13 am to provider [facility] , who verbally acknowledged these results. ASPECTS [facility] Stroke Program Early CT  Score) Not scored in this setting IMPRESSION: Masslike abnormality centered along the high right frontal parietal lobe with vasogenic edema and 8 mm midline shift. Recommend brain MRI with contrast. Electronically Signed   By: Monte Fantasia M.D.   On: 07/19/2019 10:14    Assessment: 68/M PMH HTN who presented to [facility] with  Left sided weakness and aphasia. transferred to [facility] for further neurological evaluation. CTH: Masslike abnormality centered along the high right frontal parietal lobe with vasogenic edema and 8 mm midline shift. Was loaded with keppra at [facility]. Obtain EEG and MRI w/wo contrast.   IMP: Abnormal CT Head -?Brain mass Not likely stroke  Recommendations: --EEG sunday - Keppra 500 mg BID -dexamethasone 4 mg every 6 hours -MRI w/wo contrast -Consider neuro-oncology and neurosurgery consult after MRI  Laurey Morale, MSN, NP-C [facility] [phone]  Attending neurologist's note to follow   07/19/2019, 3:32 PM   Attending Neurohospitalist Addendum  Patient seen and examined with APP/Resident. Agree with the history and physical as documented above. Agree with the plan as documented, which I helped formulate. I have independently reviewed the chart, obtained history, review of systems and examined the patient.I have personally reviewed pertinent head/neck/spine imaging (CT/MRI). Please feel free to call with any questions. --- Gabriel Portland, MD [facility] Pager: [phone]  If 7pm to 7am, please call on call as listed on AMION.

## 2019-07-19 NOTE — ED Notes (Signed)
Report given to [facility] with [facility].

## 2019-07-19 NOTE — ED Triage Notes (Signed)
Pt brought in by ems with LKW of 0800. Wife states pt got up for breakfast this morning and fixed his coffee. Was not feeling well and went to lay down. Pt states the last thing he remembers is his left arm jerking and then the EMS was there. EMS reports pt had left sided weakness and aphasia with left facial droop on their arrival which was resolving while they were trying to get him out of the floor. Pt had some left sided weakness on arrival to ED which is improving. Pt was minimally conversive on his arrival to ED and just stated over and over "I feel sick" but by the time we arrived back in room from CT he was able to tell me more about what he remembered and how he felt.

## 2019-07-19 NOTE — H&P (Addendum)
History and Physical    Gabriel Atkins URK:[id number] DOB: 1951-08-22 DOA: 07/19/2019  PCP: Patient, No Pcp Per   Patient coming from: Home via EMS  Chief Complaint: L sided weakness/aphasia  HPI: Gabriel Atkins is a 68 y.o. male with medical history significant for arthritis, GERD, hypertension, and recent anterior cervical decompression/discectomy-fusion at 4 levels from C3-C7 on 05/21/2019, who was at his usual baseline when he awoke this morning to get coffee at 8 AM.  His wife then heard a fall with some left-sided weakness and aphasia.  He then does not recall what happened, but according to the wife, he was noted to have some generalized shaking in his room as well as some jerking of his left arm.  The next thing the patient recalls is EMS arriving at his home.  Apparently, his wife had called the neurosurgeon on call, Dr. Vertell Limber, who had recommended that the patient come to the ED in the next thing she did was call EMS.  The patient has had improvement in his aphasia and is able to give me a history at this time.  He denies any headache, visual changes.  He states that his weakness is intermittent, but improving.  He is right-handed.   ED Course: Code stroke called in ED and CT of the head was performed with masslike abnormality noted to the right frontal-parietal lobe with some vasogenic edema and 8 mm midline shift noted.  EDP had spoken with neurosurgery who recommended Decadron and 10 mg IV was given.  Keppra 1 g was also given for the seizure.  Stat brain MRI with and without contrast recommended.  Laboratory data with stable vital signs noted.  Creatinine noted to be 1.41 with baseline 1.12.  Glucose of 191.  CT of the cervical spine with no acute findings noted.  COVID testing currently pending.  Review of Systems: All others reviewed and otherwise negative.  Past Medical History:  Diagnosis Date  . Arthritis   . GERD (gastroesophageal reflux disease)   . History of hiatal hernia   .  Hypertension     Past Surgical History:  Procedure Laterality Date  . ANTERIOR CERVICAL DECOMPRESSION/DISCECTOMY FUSION 4 LEVELS N/A 05/21/2019   Procedure: ANTERIOR CERVICAL DECOMPRESSION/DISCECTOMY FUSION - CERVICAL THREE-FOUR, CERVICAL FOUR-FIVE, CERVICAL FIVE-SIX, CERVICAL SIX-SEVEN;  Surgeon: Kary Kos, MD;  Location: [place];  Service: Neurosurgery;  Laterality: N/A;  . CYST EXCISION     Left Groin  . SPLENECTOMY, TOTAL       reports that he has quit smoking. He smoked 2.00 packs per day. He has never used smokeless tobacco. He reports previous alcohol use. He reports that he does not use drugs.  Allergies  Allergen Reactions  . Ciprofloxacin     Headaches, dizziness, cold sweats    . Codeine Nausea And Vomiting    Headaches, dizziness, cold sweats    . Morphine And Related     "doesn't work"  . Latex Rash  . Oxycodone Nausea Only  . Pentazocine Nausea And Vomiting    History reviewed. No pertinent family history.  Prior to Admission medications   Medication Sig Start Date End Date Taking? Authorizing Provider  acetaminophen (TYLENOL) 500 MG tablet Take 1,000 mg by mouth every 6 (six) hours as needed for moderate pain or headache.   Yes [provider]  amitriptyline (ELAVIL) 10 MG tablet Take 10 mg by mouth at bedtime as needed for sleep.   Yes [provider]  aspirin 325 MG EC  tablet Take 325 mg by mouth daily.   Yes [provider]  atorvastatin (LIPITOR) 40 MG tablet Take 40 mg by mouth at bedtime.   Yes [provider]  benazepril (LOTENSIN) 20 MG tablet Take 20 mg by mouth daily.   Yes [provider]  diphenhydrAMINE (BENADRYL) 25 MG tablet Take 25 mg by mouth at bedtime.   Yes [provider]  famotidine (PEPCID) 20 MG tablet Take 20 mg by mouth at bedtime.   Yes [provider]  Lidocaine (BLUE-EMU PAIN RELIEF DRY EX) Apply 1 application topically daily as needed (pain).   Yes [provider]   loratadine (CLARITIN) 10 MG tablet Take 10 mg by mouth daily as needed for allergies.   Yes [provider]  methocarbamol (ROBAXIN) 750 MG tablet Take 1 tablet by mouth 3 (three) times daily as needed. 07/10/19  Yes [provider]  metoprolol tartrate (LOPRESSOR) 25 MG tablet Take 25 mg by mouth 2 (two) times daily.   Yes [provider]  miconazole (MICOTIN) 2 % cream Apply 1 application topically daily as needed.    Yes [provider]  mometasone (NASONEX) 50 MCG/ACT nasal spray Place 1-2 sprays into the nose daily as needed.   Yes [provider]  Multiple Vitamins-Minerals (CENTRUM SILVER ADULT 50+) TABS Take 1 tablet by mouth daily.   Yes [provider]  promethazine (PHENERGAN) 12.5 MG tablet Take 1 tablet by mouth 3 (three) times daily as needed. 07/08/19  Yes [provider]  traMADol (ULTRAM) 50 MG tablet Take 1 tablet by mouth 4 (four) times daily as needed. 05/27/19  Yes [provider]    Physical Exam: Vitals:   07/19/19 1013 07/19/19 1030 07/19/19 1045 07/19/19 1100  BP:  111/73 114/71 116/72  Pulse:  (!) 106 98 96  Resp:  (!) 22 (!) 22 (!) 26  Temp:      TempSrc:      SpO2:  91% 93% 96%  Weight: 83.9 kg     Height: 5\' 9"  (1.753 m)       Constitutional: NAD, calm, comfortable Vitals:   07/19/19 1013 07/19/19 1030 07/19/19 1045 07/19/19 1100  BP:  111/73 114/71 116/72  Pulse:  (!) 106 98 96  Resp:  (!) 22 (!) 22 (!) 26  Temp:      TempSrc:      SpO2:  91% 93% 96%  Weight: 83.9 kg     Height: 5\' 9"  (1.753 m)      Eyes: lids and conjunctivae normal ENMT: Mucous membranes are moist.  Currently in Aspen collar. Neck: normal, supple Respiratory: clear to auscultation bilaterally. Normal respiratory effort. No accessory muscle use.  Nasal cannula 2 L. Cardiovascular: Regular rate and rhythm, no murmurs. No extremity edema. Abdomen: no tenderness, no distention. Bowel sounds positive.   Musculoskeletal:  No joint deformity upper and lower extremities.   Skin: no rashes, lesions, ulcers.  Psychiatric: Normal judgment and insight. Alert and awake. Neurological: Cranial nerves II through XII appear to be grossly intact.  Would not fully cooperate with motor or sensory exam of the extremities at this time.   Labs on Admission: I have personally reviewed following labs and imaging studies  CBC: Recent Labs  Lab 07/19/19 0950  WBC 10.5  NEUTROABS 4.5  HGB 14.3  HCT 46.2  MCV 101.5*  PLT 010   Basic Metabolic Panel: Recent Labs  Lab 07/19/19 0950  NA 137  K 3.8  CL 105  CO2 15*  GLUCOSE 191*  BUN 10  CREATININE 1.41*  CALCIUM 9.1   GFR: Estimated Creatinine Clearance: 50.8 mL/min (A) (by C-G formula based on SCr of 1.41 mg/dL (H)). Liver Function Tests: Recent Labs  Lab 07/19/19 0950  AST 36  ALT 29  ALKPHOS 82  BILITOT 0.6  PROT 7.5  ALBUMIN 4.0   No results for input(s): LIPASE, AMYLASE in the last 168 hours. No results for input(s): AMMONIA in the last 168 hours. Coagulation Profile: Recent Labs  Lab 07/19/19 0950  INR 1.3*   Cardiac Enzymes: No results for input(s): CKTOTAL, CKMB, CKMBINDEX, TROPONINI in the last 168 hours. BNP (last 3 results) No results for input(s): PROBNP in the last 8760 hours. HbA1C: No results for input(s): HGBA1C in the last 72 hours. CBG: Recent Labs  Lab 07/19/19 1010  GLUCAP 170*   Lipid Profile: No results for input(s): CHOL, HDL, LDLCALC, TRIG, CHOLHDL, LDLDIRECT in the last 72 hours. Thyroid Function Tests: No results for input(s): TSH, T4TOTAL, FREET4, T3FREE, THYROIDAB in the last 72 hours. Anemia Panel: No results for input(s): VITAMINB12, FOLATE, FERRITIN, TIBC, IRON, RETICCTPCT in the last 72 hours. Urine analysis:    Component Value Date/Time   COLORURINE YELLOW 07/19/2019 0945   APPEARANCEUR HAZY (A) 07/19/2019 0945   LABSPEC 1.014 07/19/2019 0945   PHURINE 5.0 07/19/2019 0945    GLUCOSEU NEGATIVE 07/19/2019 0945   HGBUR NEGATIVE 07/19/2019 0945   BILIRUBINUR NEGATIVE 07/19/2019 0945   KETONESUR NEGATIVE 07/19/2019 0945   PROTEINUR 100 (A) 07/19/2019 0945   NITRITE NEGATIVE 07/19/2019 0945   LEUKOCYTESUR NEGATIVE 07/19/2019 0945    Radiological Exams on Admission: Ct Cervical Spine Wo Contrast  Result Date: 07/19/2019 CLINICAL DATA:  Patient found beside bed with left-sided weakness. EXAM: CT CERVICAL SPINE WITHOUT CONTRAST TECHNIQUE: Multidetector CT imaging of the cervical spine was performed without intravenous contrast. Multiplanar CT image reconstructions were also generated. COMPARISON:  MRI 01/07/2019 FINDINGS: Alignment: Normal. Skull base and vertebrae: Anterior fusion hardware intact from C3-C7. Vertebral body heights are normal. There is mild spondylosis throughout the cervical spine. There is moderate uncovertebral joint spurring. Atlantoaxial articulation is normal. There is significant multilevel bilateral neural foraminal narrowing most prominent from the C4-5 to the C6-7 levels. No acute fracture or traumatic subluxation. Soft tissues and spinal canal: No prevertebral fluid or swelling. No visible canal hematoma. Disc levels:  Interbody fusion from C3-C7. Upper chest: No acute findings. Other: None. IMPRESSION: No acute findings. Mild spondylosis throughout the cervical spine. Significant bilateral neural foraminal narrowing at multiple levels as described. Anterior fusion hardware intact from C3-C7. Electronically Signed   By: Marin Olp M.D.   On: 07/19/2019 10:29   Ct Head Code Stroke Wo Contrast  Result Date: 07/19/2019 CLINICAL DATA:  Code stroke. Episode of arm shaking and weakness today. No symptoms prior to today. EXAM: CT HEAD WITHOUT CONTRAST TECHNIQUE: Contiguous axial images were obtained from the base of the skull through the vertex without intravenous contrast. COMPARISON:  None. FINDINGS: Brain: Masslike abnormality with  high-density/preserved cortex in the right frontal and parietal region with midline shift measuring 8 mm. Although subacute infarct with petechial hemorrhage is considered there were no symptoms yesterday such that mass with vasogenic edema and seizure is most likely. No hematoma or hydrocephalus. Vascular: No hyperdense vessel. Skull: Negative Sinuses/Orbits: Negative Other: These results were called by telephone at the time of interpretation on 07/19/2019 at 10:13 am to provider [facility] , who verbally acknowledged these results. ASPECTS [facility]  Stroke Program Early CT Score) Not scored in this setting IMPRESSION: Masslike abnormality centered along the high right frontal parietal lobe with vasogenic edema and 8 mm midline shift. Recommend brain MRI with contrast. Electronically Signed   By: Monte Fantasia M.D.   On: 07/19/2019 10:14    EKG: Independently reviewed. SR 97bpm.  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 renal panel -Consider further evaluation if no improvement  Hypertension -Currently with soft blood pressure readings -Plan to hold metoprolol and benazepril for now  GERD -famotidine  Dyslipidemia -Continue atorvastatin -Check lipid panel  Hyperglycemia -No prior history of diabetes noted -Check hemoglobin A1c   Recent history of cervical decompression/discectomy -Appreciate neurosurgery evaluation and follow-up   DVT prophylaxis: Heparin Code Status: Full Family Communication: None at bedside Disposition Plan:Transfer to [facility] for Neurosurgery and Neurology evaluation Consults called:Spoke with Dr. Rory Percy who is aware, EDP spoke with Dr. Vertell Limber with [facility] who is also aware Admission status: Inpatient, Tele   Jaclyn Carew Darleen Crocker DO [facility] Pager [phone]  If 7PM-7AM, please contact night-coverage [email or web address] Password [email or web address]  07/19/2019, 11:12 AM

## 2019-07-20 ENCOUNTER — Encounter (HOSPITAL_COMMUNITY): Payer: Self-pay | Admitting: Radiology

## 2019-07-20 ENCOUNTER — Inpatient Hospital Stay (HOSPITAL_COMMUNITY): Payer: Medicare Other

## 2019-07-20 DIAGNOSIS — R911 Solitary pulmonary nodule: Secondary | ICD-10-CM

## 2019-07-20 LAB — BASIC METABOLIC PANEL
Anion gap: 10 (ref 5–15)
BUN: 11 mg/dL (ref 8–23)
CO2: 22 mmol/L (ref 22–32)
Calcium: 9.3 mg/dL (ref 8.9–10.3)
Chloride: 107 mmol/L (ref 98–111)
Creatinine, Ser: 1.12 mg/dL (ref 0.61–1.24)
GFR calc Af Amer: >60 mL/min (ref >60)
GFR calc non Af Amer: >60 mL/min (ref >60)
Glucose, Bld: 168 mg/dL — ABNORMAL HIGH (ref 70–99)
Potassium: 4.3 mmol/L (ref 3.5–5.1)
Sodium: 139 mmol/L (ref 135–145)

## 2019-07-20 LAB — CBC
HCT: 40 % (ref 39.0–52.0)
Hemoglobin: 13 g/dL (ref 13.0–17.0)
MCH: 31.8 pg (ref 26.0–34.0)
MCHC: 32.5 g/dL (ref 30.0–36.0)
MCV: 97.8 fL (ref 80.0–100.0)
Platelets: 211 10*3/uL (ref 150–400)
RBC: 4.09 MIL/uL — ABNORMAL LOW (ref 4.22–5.81)
RDW: 13.5 % (ref 11.5–15.5)
WBC: 13.6 10*3/uL — ABNORMAL HIGH (ref 4.0–10.5)
nRBC: 0 % (ref 0.0–0.2)

## 2019-07-20 MED ORDER — IOHEXOL 300 MG/ML  SOLN
100.0000 mL | Freq: Once | INTRAMUSCULAR | Status: AC | PRN
  Administered 2019-07-20: 100 mL via INTRAVENOUS

## 2019-07-20 MED ORDER — METOPROLOL TARTRATE 25 MG PO TABS
25.0000 mg | ORAL_TABLET | Freq: Two times a day (BID) | ORAL | Status: DC
  Administered 2019-07-20 – 2019-07-22 (×5): 25 mg via ORAL
  Filled 2019-07-20 (×5): qty 1

## 2019-07-20 NOTE — Evaluation (Signed)
Occupational Therapy Evaluation and Discharge Patient Details Name: Gabriel Atkins MRN: [id number] DOB: 10/15/1951 Today's Date: 07/20/2019    History of Present Illness Gabriel Atkins is a 68 y.o. male with medical history significant for arthritis, GERD, hypertension, and recent anterior cervical decompression/discectomy-fusion at 4 levels from C3-C7 on 05/21/2019, who was at his usual baseline when he awoke this morning to get coffee at 8 AM.  His wife then heard a fall with some left-sided weakness and aphasia. Wife noted generalized shaking. CT of head was positive for abnormal mass frontoparietal region with midline shift.    Clinical Impression   This 68 yo male admitted with above presents to acute OT at an overall S level due to now a history of fall and he still needs A with c-collar. Wife can provide S and has been Aing with collar. No further OT needs, we will sign off.     Follow Up Recommendations  No OT follow up;Supervision/Assistance - 24 hour    Equipment Recommendations  None recommended by OT       Precautions / Restrictions Precautions Precautions: Fall;Cervical Precaution Booklet Issued: No Precaution Comments: cervical fusion 7/20 Required Braces or Orthoses: Cervical Brace Cervical Brace: Hard collar(per pt he can have it off for showering) Restrictions Weight Bearing Restrictions: No      Mobility Bed Mobility Overal bed mobility: Independent                Transfers Overall transfer level: Needs assistance   Transfers: Sit to/from Stand Sit to Stand: Supervision         General transfer comment: Pt ambulated 200 feet without AD and S    Balance Overall balance assessment: No apparent balance deficits (not formally assessed)                                         ADL either performed or assessed with clinical judgement   ADL                                         General ADL Comments: Overall at a  S level     Vision Baseline Vision/History: Wears glasses Wears Glasses: At all times Patient Visual Report: No change from baseline              Pertinent Vitals/Pain Pain Assessment: No/denies pain     Hand Dominance Right   Extremity/Trunk Assessment Upper Extremity Assessment Upper Extremity Assessment: LUE deficits/detail LUE Deficits / Details: Reports minimal numbness in finger tips (has been this way since cervical surgery per pt report). He did on one occassion drop a paper towel he was wiping his hands with LUE Sensation: decreased proprioception           Communication Communication Communication: No difficulties   Cognition Arousal/Alertness: Awake/alert Behavior During Therapy: WFL for tasks assessed/performed Overall Cognitive Status: Within Functional Limits for tasks assessed                                                Home Living Family/patient expects to be discharged to:: Private residence Living Arrangements: Spouse/significant other Available Help at Discharge: Family;Neighbor;Available  24 hours/day Type of Home: House Home Access: Stairs to enter [employer] of Steps: 4   Home Layout: One level     Bathroom Shower/Tub: Tub/shower unit         Home Equipment: Shower seat;Toilet riser;Grab bars - tub/shower   Additional Comments: pt's wife disabled, uses rollator. Neighbor drives them places and brings food if needed or they do grocery delivery      Prior Functioning/Environment Level of Independence: Needs assistance    ADL's / Homemaking Assistance Needed: Wife does help him with doffing and donning c-collar   Comments: has been moving well since ACDF but has continued to have burning and tingling in L hand, face, and foot                 OT Goals(Current goals can be found in the care plan section) Acute Rehab OT Goals Patient Stated Goal: go home  OT Frequency:                AM-PAC  OT "6 Clicks" Daily Activity     Outcome Measure Help from another person eating meals?: None Help from another person taking care of personal grooming?: A Little Help from another person toileting, which includes using toliet, bedpan, or urinal?: A Little Help from another person bathing (including washing, rinsing, drying)?: A Little Help from another person to put on and taking off regular upper body clothing?: A Little Help from another person to put on and taking off regular lower body clothing?: A Little 6 Click Score: 19   End of Session Equipment Utilized During Treatment: Gait belt  Activity Tolerance: Patient tolerated treatment well Patient left: in bed;with call bell/phone within reach;with bed alarm set  OT Visit Diagnosis: Other (comment)(decreased feeling in left fingers)                Time: 1856-3149 OT Time Calculation (min): 25 min Charges:  OT General Charges $OT Visit: 1 Visit OT Evaluation $OT Eval Moderate Complexity: 1 Mod OT Treatments $Self Care/Home Management : 8-22 mins  [place] Circle, OTR/L Acute [employer] Pager [phone] Office [phone]     Almon Register 07/20/2019, 4:29 PM

## 2019-07-20 NOTE — Evaluation (Signed)
Clinical/Bedside Swallow Evaluation Patient Details  Name: Gabriel Atkins MRN: [id number] Date of Birth: [birth year]  Today's Date: 07/20/2019 Time: SLP Start Time (ACUTE ONLY): 0932 SLP Stop Time (ACUTE ONLY): 0947 SLP Time Calculation (min) (ACUTE ONLY): 15 min  Past Medical History:  Past Medical History:  Diagnosis Date  . Arthritis   . GERD (gastroesophageal reflux disease)   . History of hiatal hernia   . Hypertension    Past Surgical History:  Past Surgical History:  Procedure Laterality Date  . ANTERIOR CERVICAL DECOMPRESSION/DISCECTOMY FUSION 4 LEVELS N/A 05/21/2019   Procedure: ANTERIOR CERVICAL DECOMPRESSION/DISCECTOMY FUSION - CERVICAL THREE-FOUR, CERVICAL FOUR-FIVE, CERVICAL FIVE-SIX, CERVICAL SIX-SEVEN;  Surgeon: Kary Kos, MD;  Location: [place];  Service: Neurosurgery;  Laterality: N/A;  . CYST EXCISION     Left Groin  . SPLENECTOMY, TOTAL     HPI:  Gabriel Atkins is a 68 y.o. male with medical history significant for arthritis, GERD, hypertension, and recent anterior cervical decompression/discectomy-fusion at 4 levels from C3-C7 on 05/21/2019, who was at his usual baseline when he awoke this morning to get coffee at 8 AM.  His wife then heard a fall associated with some left-sided weakness and aphasia.  He then does not recall what happened, but according to the wife, he was noted to have some generalized shaking as well as some jerking of his left arm.  The next thing the patient recalls is EMS arriving at his home.  Apparently, his wife had called the neurosurgeon on call, Dr. Vertell Limber, who had recommended that the patient come to the ED.  The patient has had improvement in his aphasia and is able to give me a history at this time.  He denies any headache, visual changes.  He states that his weakness is intermittent, but improving.  He is right-handed.  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 Only trace   Assessment / Plan / Recommendation Clinical Impression  Clinical swallowing evaluation was completed using thin liquids via spoon, cup and straw, pureed material and dry solids.  The patient endorsed mild issues swallowing since his ACDF in July described as having issues getting dry materials to go down.   He also reported long standing issues specifically drinking water.  Cranial nerve exam was completed and unremarkable.  Lingual, labial, facial and jaw range of motion and strength were adequate.  Sensation appeared to be intact.  Overt swallowing issues were not seen until the patient attempted to complete a 3 oz water challenge.  He was noted to have a strong cough response following attempt to continuously drink 3 ozs of water.  This was not seen given single sips of thin liquids before or after admistration of dry solids.  Mastication of dry solids appeared to be adequate with no oral residue seen.  Recommend that he continue on a regular diet with thin liquids.  He should take single sips of liquids pending results of an MBS next date.   SLP Visit Diagnosis: Dysphagia, unspecified (R13.10)    Aspiration Risk  Mild aspiration risk    Diet Recommendation   Regular with thin liquids - single sips of liquids  Medication Administration: Whole meds with puree    Other  Recommendations Oral Care Recommendations: Oral care BID   Follow up Recommendations Other (comment)(TBD)        Swallow Study   General Date of  Onset: 07/19/19 HPI: Gabriel Atkins is a 68 y.o. male with medical history significant for arthritis, GERD, hypertension, and recent anterior cervical decompression/discectomy-fusion at 4 levels from C3-C7 on 05/21/2019, who was at his usual baseline when he awoke this morning to get coffee at 8 AM.  His wife then heard a fall associated with some left-sided weakness and aphasia.  He then does not recall what happened, but according  to the wife, he was noted to have some generalized shaking as well as some jerking of his left arm.  The next thing the patient recalls is EMS arriving at his home.  Apparently, his wife had called the neurosurgeon on call, Dr. Vertell Limber, who had recommended that the patient come to the ED.  The patient has had improvement in his aphasia and is able to give me a history at this time.  He denies any headache, visual changes.  He states that his weakness is intermittent, but improving.  He is right-handed.  MRI of the head was completed and showing a large up to 9 cm infiltrative, predominantly T2 hyperintense mass in the right hemisphere with multilobar involvement. Only trace Type of Study: Bedside Swallow Evaluation Previous Swallow Assessment: None noted at [facility].   Diet Prior to this Study: Regular;Thin liquids Temperature Spikes Noted: No History of Recent Intubation: No Behavior/Cognition: Alert;Cooperative;Pleasant mood Oral Cavity Assessment: Within Functional Limits Oral Care Completed by SLP: No Oral Cavity - Dentition: Adequate natural dentition Vision: Functional for self-feeding Self-Feeding Abilities: Able to feed self Patient Positioning: Upright in bed Baseline Vocal Quality: Normal Volitional Swallow: Able to elicit    Oral/Motor/Sensory Function Overall Oral Motor/Sensory Function: Within functional limits   Ice Chips Ice chips: Not tested   Thin Liquid Thin Liquid: Impaired Presentation: Cup;Self Fed;Spoon;Straw Pharyngeal  Phase Impairments: Cough - Immediate(given serial sips of thin liquids)    Nectar Thick Nectar Thick Liquid: Not tested   Honey Thick Honey Thick Liquid: Not tested   Puree Puree: Within functional limits Presentation: Spoon;Self Fed   Solid     Solid: Within functional limits Presentation: [place], [place], [place] Acute Rehab SLP [phone]  Lamar Sprinkles 07/20/2019,9:57 AM

## 2019-07-20 NOTE — Consult Note (Signed)
Reason for Consult:brain tumor and seizure Referring Physician: [name] is an 68 y.o. male.  HPI:  Gabriel Atkins is an 68 y.o. male with PMH HTN who presented to [facility] with  Left sided weakness and aphasia. transferred to [facility] for further neurological evaluation.  He had undergone anterior cervical fusion surgery for left arm complaints on 05/21/2019 by Dr. Saintclair Halsted.  He has been recovering well following that surgery, but has still been having tingling and spasms in his left arm.    Patient got up this morning about 0800 for coffee and was normal. Per patient he remembers having a "spell" his left arm started jerking so he sat down on the bed, and then his whole body started shaking and jerking. It was shaking and jerking so hard that he fell off the bed. EMS was called. He then doesn't remember anything after falling until paramedics arrived.  No prior seizure history. Denies any HA, visual changes.    Patient's wife called me after this event and I said this sounded more like a stroke and was unlikely to be related to his neck.  I advised transfer by EMS to hospital.  Head CT at [facility] was consistent with right hemispheric mass and was felt to be unlikely secondary to stroke.  Patient was transferred to [facility] and started on keppra and decadron.  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    Past Medical History:  Diagnosis Date  . Arthritis   . GERD (gastroesophageal reflux disease)   . History of hiatal hernia   . Hypertension     Past Surgical History:  Procedure Laterality Date  . ANTERIOR CERVICAL DECOMPRESSION/DISCECTOMY FUSION 4 LEVELS N/A 05/21/2019   Procedure: ANTERIOR CERVICAL DECOMPRESSION/DISCECTOMY FUSION - CERVICAL THREE-FOUR, CERVICAL FOUR-FIVE, CERVICAL FIVE-SIX, CERVICAL SIX-SEVEN;  Surgeon: Kary Kos, MD;  Location: [facility];  Service: Neurosurgery;  Laterality: N/A;  . CYST EXCISION     Left Groin  . SPLENECTOMY, TOTAL      History reviewed. No pertinent  family history.  Social History:  reports that he has quit smoking. He smoked 2.00 packs per day. He has never used smokeless tobacco. He reports previous alcohol use. He reports that he does not use drugs.  Allergies:  Allergies  Allergen Reactions  . Ciprofloxacin     Headaches, dizziness, cold sweats    . Codeine Nausea And Vomiting    Headaches, dizziness, cold sweats    . Morphine And Related     "doesn't work"  . Latex Rash  . Oxycodone Nausea Only  . Pentazocine Nausea And Vomiting    Medications: I have reviewed the patient's current medications.  Results for orders placed or performed during the hospital encounter of 07/19/19 (from the past 48 hour(s))  Urine rapid drug screen (hosp performed)     Status: None   Collection Time: 07/19/19  9:45 AM  Result Value Ref Range   Opiates NONE DETECTED NONE DETECTED   Cocaine NONE DETECTED NONE DETECTED   Benzodiazepines NONE DETECTED NONE DETECTED   Amphetamines NONE DETECTED NONE DETECTED   Tetrahydrocannabinol NONE DETECTED NONE DETECTED   Barbiturates NONE DETECTED NONE DETECTED    Comment: (NOTE) DRUG SCREEN FOR MEDICAL PURPOSES ONLY.  IF CONFIRMATION IS NEEDED FOR ANY PURPOSE, NOTIFY LAB WITHIN 5 DAYS. LOWEST DETECTABLE LIMITS FOR URINE DRUG SCREEN Drug Class                     Cutoff (ng/mL)  Amphetamine and metabolites    1000 Barbiturate and metabolites    200 Benzodiazepine                 093 Tricyclics and metabolites     300 Opiates and metabolites        300 Cocaine and metabolites        300 THC                            50 Performed at [facility], [address] [place], [place] [address]   Urinalysis, Routine w reflex microscopic     Status: Abnormal   Collection Time: 07/19/19  9:45 AM  Result Value Ref Range   Color, Urine YELLOW YELLOW   APPearance HAZY (A) CLEAR   Specific Gravity, Urine 1.014 1.005 - 1.030   pH 5.0 5.0 - 8.0   Glucose, UA NEGATIVE NEGATIVE mg/dL   Hgb urine dipstick  NEGATIVE NEGATIVE   Bilirubin Urine NEGATIVE NEGATIVE   Ketones, ur NEGATIVE NEGATIVE mg/dL   Protein, ur 100 (A) NEGATIVE mg/dL   Nitrite NEGATIVE NEGATIVE   Leukocytes,Ua NEGATIVE NEGATIVE   RBC / HPF 6-10 0 - 5 RBC/hpf   WBC, UA 0-5 0 - 5 WBC/hpf   Bacteria, UA RARE (A) NONE SEEN   Squamous Epithelial / LPF 0-5 0 - 5   Mucus PRESENT    Hyaline Casts, UA PRESENT     Comment: Performed at [facility], [address], [place], [place] [address]  Protime-INR     Status: Abnormal   Collection Time: 07/19/19  9:50 AM  Result Value Ref Range   Prothrombin Time 15.8 (H) 11.4 - 15.2 seconds   INR 1.3 (H) 0.8 - 1.2    Comment: (NOTE) INR goal varies based on device and disease states. Performed at [facility], [address], [place], [place] [address]   APTT     Status: None   Collection Time: 07/19/19  9:50 AM  Result Value Ref Range   aPTT 31 24 - 36 seconds    Comment: Performed at [facility], [address] [place], [place] [address]  CBC     Status: Abnormal   Collection Time: 07/19/19  9:50 AM  Result Value Ref Range   WBC 10.5 4.0 - 10.5 K/uL   RBC 4.55 4.22 - 5.81 MIL/uL   Hemoglobin 14.3 13.0 - 17.0 g/dL   HCT 46.2 39.0 - 52.0 %   MCV 101.5 (H) 80.0 - 100.0 fL   MCH 31.4 26.0 - 34.0 pg   MCHC 31.0 30.0 - 36.0 g/dL   RDW 13.5 11.5 - 15.5 %   Platelets 225 150 - 400 K/uL   nRBC 0.0 0.0 - 0.2 %    Comment: Performed at [facility], [address], [place], [place] [address]  Differential     Status: Abnormal   Collection Time: 07/19/19  9:50 AM  Result Value Ref Range   Neutrophils Relative % 43 %   Neutro Abs 4.5 1.7 - 7.7 K/uL   Lymphocytes Relative 45 %   Lymphs Abs 4.7 (H) 0.7 - 4.0 K/uL   Monocytes Relative 9 %   Monocytes Absolute 1.0 0.1 - 1.0 K/uL   Eosinophils Relative 2 %   Eosinophils Absolute 0.2 0.0 - 0.5 K/uL   Basophils Relative 1 %   Basophils Absolute 0.1 0.0 - 0.1 K/uL   Immature Granulocytes 0 %   Abs Immature  Granulocytes 0.04 0.00 - 0.07  K/uL    Comment: Performed at [facility], [address], [place], [place] [address]  Comprehensive metabolic panel     Status: Abnormal   Collection Time: 07/19/19  9:50 AM  Result Value Ref Range   Sodium 137 135 - 145 mmol/L   Potassium 3.8 3.5 - 5.1 mmol/L   Chloride 105 98 - 111 mmol/L   CO2 15 (L) 22 - 32 mmol/L   Glucose, Bld 191 (H) 70 - 99 mg/dL   BUN 10 8 - 23 mg/dL   Creatinine, Ser 1.41 (H) 0.61 - 1.24 mg/dL   Calcium 9.1 8.9 - 10.3 mg/dL   Total Protein 7.5 6.5 - 8.1 g/dL   Albumin 4.0 3.5 - 5.0 g/dL   AST 36 15 - 41 U/L   ALT 29 0 - 44 U/L   Alkaline Phosphatase 82 38 - 126 U/L   Total Bilirubin 0.6 0.3 - 1.2 mg/dL   GFR calc non Af Amer 51 (L) >60 mL/min   GFR calc Af Amer 59 (L) >60 mL/min   Anion gap 17 (H) 5 - 15    Comment: Performed at [facility], [address] [place], [place] [address]  Ethanol     Status: None   Collection Time: 07/19/19  9:50 AM  Result Value Ref Range   Alcohol, Ethyl (B) <10 <10 mg/dL    Comment: (NOTE) Lowest detectable limit for serum alcohol is 10 mg/dL. For medical purposes only. Performed at [facility], [address], [place], [place] [address]   Troponin I (High Sensitivity)     Status: None   Collection Time: 07/19/19  9:50 AM  Result Value Ref Range   Troponin I (High Sensitivity) 11 <18 ng/L    Comment: (NOTE) Elevated high sensitivity troponin I (hsTnI) values and significant  changes across serial measurements may suggest ACS but many other  chronic and acute conditions are known to elevate hsTnI results.  Refer to the "Links" section for chest pain algorithms and additional  guidance. Performed at [facility], [address] [place],  [address]   Lipid panel     Status: Abnormal   Collection Time: 07/19/19  9:50 AM  Result Value Ref Range   Cholesterol 142 0 - 200 mg/dL   Triglycerides 171 (H) <150 mg/dL   HDL 44 >40 mg/dL   Total CHOL/HDL Ratio 3.2 RATIO   VLDL 34 0 - 40 mg/dL   LDL Cholesterol  64 0 - 99 mg/dL    Comment:        Total Cholesterol/HDL:CHD Risk Coronary Heart Disease Risk Table                     Men   Women  1/2 Average Risk   3.4   3.3  Average Risk       5.0   4.4  2 X Average Risk   9.6   7.1  3 X Average Risk  23.4   11.0        Use the calculated Patient Ratio above and the CHD Risk Table to determine the patient's CHD Risk.        ATP III CLASSIFICATION (LDL):  <100     mg/dL   Optimal  100-129  mg/dL   Near or Above                    Optimal  130-159  mg/dL   Borderline  160-189  mg/dL   High  >190     mg/dL   Very High Performed at [facility], [address], [place], [place] [address]   CBG monitoring, ED     Status: Abnormal   Collection Time: 07/19/19 10:10 AM  Result Value Ref Range   Glucose-Capillary 170 (H) 70 - 99 mg/dL  SARS Coronavirus 2 [facility] order, Performed in [facility] hospital lab) Nasopharyngeal Nasopharyngeal Swab     Status: None   Collection Time: 07/19/19 10:59 AM   Specimen: Nasopharyngeal Swab  Result Value Ref Range   SARS Coronavirus 2 NEGATIVE NEGATIVE    Comment: (NOTE) If result is NEGATIVE SARS-CoV-2 target nucleic acids are NOT DETECTED. The SARS-CoV-2 RNA is generally detectable in upper and lower  respiratory specimens during the acute phase of infection. The lowest  concentration of SARS-CoV-2 viral copies this assay can detect is 250  copies / mL. A negative result does not preclude SARS-CoV-2 infection  and should not be used as the sole basis for treatment or other  patient management decisions.  A negative result may occur with  improper specimen collection / handling, submission of specimen other  than nasopharyngeal swab, presence of viral mutation(s) within the  areas targeted by this assay, and inadequate number of viral copies  (<250 copies / mL). A negative result must be combined with clinical  observations, patient history, and epidemiological information. If result is  POSITIVE SARS-CoV-2 target nucleic acids are DETECTED. The SARS-CoV-2 RNA is generally detectable in upper and lower  respiratory specimens dur ing the acute phase of infection.  Positive  results are indicative of active infection with SARS-CoV-2.  Clinical  correlation with patient history and other diagnostic information is  necessary to determine patient infection status.  Positive results do  not rule out bacterial infection or co-infection with other viruses. If result is PRESUMPTIVE POSTIVE SARS-CoV-2 nucleic acids MAY BE PRESENT.   A presumptive positive result was obtained on the submitted specimen  and confirmed on repeat testing.  While 2019 novel coronavirus  (SARS-CoV-2) nucleic acids may be present in the submitted sample  additional confirmatory testing may be necessary for epidemiological  and / or clinical management purposes  to differentiate between  SARS-CoV-2 and other Sarbecovirus currently known to infect humans.  If clinically indicated additional testing with an alternate test  methodology [phone]) is advised. The SARS-CoV-2 RNA is generally  detectable in upper and lower respiratory sp ecimens during the acute  phase of infection. The expected result is Negative. Fact Sheet for Patients:  StrictlyIdeas.no Fact Sheet for Healthcare Providers: BankingDealers.co.za This test is not yet approved or cleared by the [place] FDA and has been authorized for detection and/or diagnosis of SARS-CoV-2 by FDA under an Emergency Use Authorization (EUA).  This EUA will remain in effect (meaning this test can be used) for the duration of the COVID-19 declaration under Section 564(b)(1) of the Act, 21 U.S.C. section 360bbb-3(b)(1), unless the authorization is terminated or revoked sooner. Performed at [facility], [place], [address], [place], [place] [address]   Basic metabolic panel     Status: Abnormal   Collection Time:  07/20/19  3:49 AM  Result Value Ref Range   Sodium 139 135 - 145 mmol/L   Potassium 4.3 3.5 - 5.1 mmol/L   Chloride 107 98 - 111 mmol/L   CO2 22 22 - 32 mmol/L   Glucose, Bld 168 (H) 70 - 99 mg/dL   BUN 11 8 -  23 mg/dL   Creatinine, Ser 1.12 0.61 - 1.24 mg/dL   Calcium 9.3 8.9 - 10.3 mg/dL   GFR calc non Af Amer >60 >60 mL/min   GFR calc Af Amer >60 >60 mL/min   Anion gap 10 5 - 15    Comment: Performed at [facility] [address], [place], [place] [address]  CBC     Status: Abnormal   Collection Time: 07/20/19  3:49 AM  Result Value Ref Range   WBC 13.6 (H) 4.0 - 10.5 K/uL   RBC 4.09 (L) 4.22 - 5.81 MIL/uL   Hemoglobin 13.0 13.0 - 17.0 g/dL   HCT 40.0 39.0 - 52.0 %   MCV 97.8 80.0 - 100.0 fL   MCH 31.8 26.0 - 34.0 pg   MCHC 32.5 30.0 - 36.0 g/dL   RDW 13.5 11.5 - 15.5 %   Platelets 211 150 - 400 K/uL   nRBC 0.0 0.0 - 0.2 %    Comment: Performed at [facility], [place] [address] [place], [place] [address]    Ct Cervical Spine Wo Contrast  Result Date: 07/19/2019 CLINICAL DATA:  Patient found beside bed with left-sided weakness. EXAM: CT CERVICAL SPINE WITHOUT CONTRAST TECHNIQUE: Multidetector CT imaging of the cervical spine was performed without intravenous contrast. Multiplanar CT image reconstructions were also generated. COMPARISON:  MRI 01/07/2019 FINDINGS: Alignment: Normal. Skull base and vertebrae: Anterior fusion hardware intact from C3-C7. Vertebral body heights are normal. There is mild spondylosis throughout the cervical spine. There is moderate uncovertebral joint spurring. Atlantoaxial articulation is normal. There is significant multilevel bilateral neural foraminal narrowing most prominent from the C4-5 to the C6-7 levels. No acute fracture or traumatic subluxation. Soft tissues and spinal canal: No prevertebral fluid or swelling. No visible canal hematoma. Disc levels:  Interbody fusion from C3-C7. Upper chest: No acute findings. Other: None.  IMPRESSION: No acute findings. Mild spondylosis throughout the cervical spine. Significant bilateral neural foraminal narrowing at multiple levels as described. Anterior fusion hardware intact from C3-C7. Electronically Signed   By: Marin Olp M.D.   On: 07/19/2019 10:29   Mr Jeri Cos TK Contrast  Result Date: 07/19/2019 CLINICAL DATA:  68 year old male with presentation of shaking and weakness. Masslike abnormality on noncontrast head CT earlier today. EXAM: MRI HEAD WITHOUT AND WITH CONTRAST TECHNIQUE: Multiplanar, multiecho pulse sequences of the brain and surrounding structures were obtained without and with intravenous contrast. CONTRAST:  26mL GADAVIST GADOBUTROL 1 MMOL/ML IV SOLN COMPARISON:  Head CT at 0947 hours. FINDINGS: Brain: Within the right hemisphere there is a large masslike area of heterogeneously increased T2 and FLAIR hyperintensity with indistinct margins (series 9, image 17), T1 hypointensity. The right frontal, parietal, posterior temporal and anterior occipital lobes are affected. Following contrast only trace petechial type abnormal postcontrast enhancement seen in the right parietal region on series 18, image 43 and series 19, image 9. The abnormality encompasses 92 x 63 by 64 millimeters (AP by transverse by CC), and does appear to track into the splenium of the corpus callosum on series 9, image 16. The remaining corpus callosum appears spared. On DWI there is a heterogeneous region of partially restricted diffusion in the right parietal lobe which corresponds to the area of petechial enhancement (series 5 images 89-91). There may be trace hemosiderin in that region. Elsewhere diffusion is facilitated. Associated intracranial mass effect with 8 millimeters of leftward midline shift and partial effacement of the right lateral ventricle. Basilar cisterns remain patent. No  signal abnormality or similar findings in the left hemisphere hemisphere, brainstem or cerebellum. No dural  thickening. No other abnormal intracranial enhancement. No superimposed acute infarct. No restricted diffusion to suggest acute infarction. No ventriculomegaly, extra-axial collection or acute intracranial hemorrhage. Cervicomedullary junction and pituitary are within normal limits. Vascular: Major intracranial vascular flow voids are preserved. The major dural venous sinuses are enhancing and appear to be patent. Skull and upper cervical spine: Partially visible cervical ACDF. Visualized bone marrow signal is within normal limits. Sinuses/Orbits: Negative orbits. Paranasal Visualized paranasal sinuses and mastoids are stable and well pneumatized. Other: Visible internal auditory structures appear normal. Scalp and face soft tissues appear negative. IMPRESSION: 1. Large up to 9 cm infiltrative, predominantly T2 hyperintense mass in the right hemisphere with multilobar involvement. Only trace petechial enhancement in a right parietal region where hypercellularity is suspected on diffusion imaging. Some involvement of the splenium is suspected, but no extension across midline. 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 High-grade Astrocytoma and Glioblastoma are possible. Gliomatosis cerebri is possible. CNS lymphoma is less likely. 2. Stable intracranial mass effect from earlier today. Electronically Signed   By: Genevie Ann M.D.   On: 07/19/2019 19:21   Ct Head Code Stroke Wo Contrast  Result Date: 07/19/2019 CLINICAL DATA:  Code stroke. Episode of arm shaking and weakness today. No symptoms prior to today. EXAM: CT HEAD WITHOUT CONTRAST TECHNIQUE: Contiguous axial images were obtained from the base of the skull through the vertex without intravenous contrast. COMPARISON:  None. FINDINGS: Brain: Masslike abnormality with high-density/preserved cortex in the right frontal and parietal region with midline shift measuring 8 mm. Although subacute infarct with petechial  hemorrhage is considered there were no symptoms yesterday such that mass with vasogenic edema and seizure is most likely. No hematoma or hydrocephalus. Vascular: No hyperdense vessel. Skull: Negative Sinuses/Orbits: Negative Other: These results were called by telephone at the time of interpretation on 07/19/2019 at 10:13 am to provider [facility] , who verbally acknowledged these results. ASPECTS [facility] Stroke Program Early CT Score) Not scored in this setting IMPRESSION: Masslike abnormality centered along the high right frontal parietal lobe with vasogenic edema and 8 mm midline shift. Recommend brain MRI with contrast. Electronically Signed   By: Monte Fantasia M.D.   On: 07/19/2019 10:14    Review of Systems - Negative except per HPI    Blood pressure 129/78, pulse 74, temperature 98.2 F (36.8 C), temperature source Oral, resp. rate 16, height 5\' 9"  (1.753 m), weight 83.9 kg, SpO2 96 %. Physical Exam  Constitutional: He is oriented to person, place, and time. He appears well-developed and well-nourished.  HENT:  Head: Normocephalic and atraumatic.  Eyes: Pupils are equal, round, and reactive to light. EOM are normal.  Neck: Decreased range of motion present.  Patient has healed anterior cervical incision and is wearing Vista cervical collar.  Neurological: He is alert and oriented to person, place, and time. He has normal strength and normal reflexes. No cranial nerve deficit or sensory deficit. GCS eye subscore is 4. GCS verbal subscore is 5. GCS motor subscore is 6.  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  Lymphoma is felt to be less likely.  Patient will likely require biopsy of this lesion.  Metastatic workup is being performed.  No seizures since  started on Keppra.  Dr. Saintclair Halsted to follow patient.  Peggyann Shoals, MD 07/20/2019, 9:08 AM

## 2019-07-20 NOTE — Progress Notes (Signed)
PROGRESS NOTE  Gabriel Atkins  DOB: 12-22-1950  PCP: Patient, No Pcp Per XTK:[id number]  DOA: 07/19/2019  LOS: 1 day   Brief narrative: Patientis a 68 y.o.malewith PMH of HTN, arthritis, GERD and a recent anterior cervical decompression/discectomy-fusion at 4 levels from C3-C7 on 05/11/2019 by Dr. Saintclair Halsted. He has been recovering well following that surgery, but has still been having tingling and spasms in his left arm.   He presented to [facility] on 9/12 with left sided weakness and aphasia.  Patient got up on 9/12 about 0800 for coffee and was normal.Per patient he remembers having a "spell" his left arm started jerking so he sat down on the bed, and then his whole body started shaking and jerking. It was shaking and jerking so hard that he fell off the bed. EMS was called. He then doesn't remember anythingafter fallinguntil paramedics arrived. No prior seizure history. Denies any HA, visual changes.   Head CT at [facility] was consistent with right hemispheric mass and was felt to be unlikely secondary to stroke.  Patient was transferred to [facility] and started on keppra and decadron.   MRI brain was obtained which showed findings most c/w infiltrating primary brain neoplasm.   Patient was admitted under hospitalist medicine service. Neurosurgery consultation was obtained.  Subjective: Patient was seen and examined this morning.  Pleasant elderly Caucasian male.  Not in distress at this time.  Continues to have numbness in the left foot and left hand.  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  Neurosurgery consult obtained.  Patient would like require biopsy of this lesion. -Per my conversation with neurosurgeon Dr. Vertell Limber this morning, I will obtain CT scanning of chest, abdomen and pelvis with contrast to look for any primary malignancy.  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 renal panel -Consider further evaluation if no improvement  Hypertension -Currently with soft blood pressure readings -Resume metoprolol.  Keep benazepril on hold for now.    GERD -famotidine  Dyslipidemia -Continue atorvastatin -HDL 44, LDL 64.  Hyperglycemia -No prior history of diabetes noted -Pending hemoglobin A1c.  Recent history of cervical decompression/discectomy -Appreciate neurosurgery evaluation and follow-up  Body mass index is 27.32 kg/m. Mobility: Encourage ambulation DVT prophylaxis:  Avoid heparin because of brain tumor.  SCD boots ordered Code Status:   Code Status: Full Code  Family Communication:  Expected Discharge:  Pending neurosurgical evaluation and planning.  Consultants:  Neurology, neurosurgery  Procedures:    Antimicrobials: Anti-infectives (From admission, onward)   None      Diet Order            Diet Heart Room service appropriate? Yes; Fluid consistency: Thin  Diet effective now              Infusions:  . levETIRAcetam 500 mg (07/20/19 0129)    Scheduled Meds: . aspirin  325 mg Oral Daily  . atorvastatin  40 mg Oral QHS  . dexamethasone (DECADRON) injection  4 mg Intravenous Q6H  . diphenhydrAMINE  25 mg Oral QHS  .  famotidine  20 mg Oral QHS  . fluticasone  1 spray Each Nare Daily  . heparin  5,000 Units Subcutaneous Q8H  . multivitamin with minerals  1 tablet Oral Daily    PRN meds: acetaminophen **OR** acetaminophen, loratadine, methocarbamol, ondansetron **OR** ondansetron (ZOFRAN) IV, ondansetron   Objective: Vitals:    07/20/19 0913 07/20/19 1113  BP: 111/68 112/65  Pulse: 88 100  Resp: 18   Temp: 98 F (36.7 C) 98 F (36.7 C)  SpO2: 94% 92%    Intake/Output Summary (Last 24 hours) at 07/20/2019 1128 Last data filed at 07/20/2019 0300 Gross per 24 hour  Intake 444.74 ml  Output -  Net 444.74 ml   Filed Weights   07/19/19 1013  Weight: 83.9 kg   Weight change:  Body mass index is 27.32 kg/m.   Physical Exam: General exam: Appears calm and comfortable.  Skin: No rashes, lesions or ulcers. HEENT: Atraumatic, normocephalic, supple neck, no obvious bleeding Lungs: Clear to auscultation bilaterally CVS: Regular rate and rhythm, no murmur GI/Abd soft, nontender, nondistended, bowel sound present CNS: Alert, awake oriented x3 Psychiatry: Mood appropriate Extremities: No pedal edema, no calf tenderness  Data Review: I have personally reviewed the laboratory data and studies available.  Recent Labs  Lab 07/19/19 0950 07/20/19 0349  WBC 10.5 13.6*  NEUTROABS 4.5  --   HGB 14.3 13.0  HCT 46.2 40.0  MCV 101.5* 97.8  PLT 225 211   Recent Labs  Lab 07/19/19 0950 07/20/19 0349  NA 137 139  K 3.8 4.3  CL 105 107  CO2 15* 22  GLUCOSE 191* 168*  BUN 10 11  CREATININE 1.41* 1.12  CALCIUM 9.1 9.3    Terrilee Croak, MD  [facility] 07/20/2019

## 2019-07-20 NOTE — Progress Notes (Signed)
EEG with right hemispheric cortical dysfunction mostly in the parieto-occipital region consistent with the underlying mass.  No seizures or epileptiform discharges. Follow-up per primary team and neurosurgery. Consider consulting neuro-oncology.  Primary team aware. Inpatient neurology to sign off  Please call us with questions  -- Amie Portland, MD [facility] Pager: [phone] If 7pm to 7am, please call on call as listed on AMION.

## 2019-07-20 NOTE — Progress Notes (Signed)
EEG Completed; Results Pending  

## 2019-07-20 NOTE — Progress Notes (Addendum)
NEURO HOSPITALIST PROGRESS NOTE   Subjective: Patient awake, alert in bed. Back to baseline.   Exam: Vitals:   07/19/19 2323 07/20/19 0307  BP: 102/64 129/78  Pulse: 83 74  Resp: 15 16  Temp: 98.6 F (37 C) 98.2 F (36.8 C)  SpO2: 96% 96%    Physical Exam   HEENT-  Normocephalic, no lesions, without obvious abnormality.  Normal external eye and conjunctiva.   Cardiovascular- S1-S2 audible, pulses palpable throughout   Lungs-no rhonchi or wheezing noted, no excessive working breathing.  Saturations within normal limits on RA Abdomen- All 4 quadrants palpated and nontender Extremities- Warm, dry and intact Musculoskeletal-no joint tenderness, deformity or swelling Skin-warm and dry, no hyperpigmentation, vitiligo, or suspicious lesions   Neuro:  Mental Status: Alert, oriented name/age/month/year/ city,  thought content appropriate.  Speech fluent without evidence of aphasia.  Able to follow  without difficulty. Cranial Nerves: II: ; Visual fields grossly normal,  III,IV, VI: ptosis not present, extra-ocular motions intact bilaterally pupils equal, round, reactive to light and accommodation V,VII: smile symmetric, facial light touch sensation normal bilaterally VIII: hearing normal bilaterally IX,X: uvula rises symmetrically XI: bilateral shoulder shrug XII: midline tongue extension Motor: Right :  Upper extremity   5/5                                      Left:     Upper extremity   5/5             Lower extremity   5/5                                                  Lower extremity   5/5 Tone and bulk:normal tone throughout; no atrophy noted Sensory:  light touch intact throughout, bilaterally; patient neglects LLE with DSS. Deep Tendon Reflexes: 2+ and symmetric throughout Plantars: Right: downgoing                                Left: downgoing Cerebellar: normal finger-to-nose,  normal heel-to-shin test Gait: deferred    Medications:   Scheduled: . aspirin  325 mg Oral Daily  . atorvastatin  40 mg Oral QHS  . dexamethasone (DECADRON) injection  4 mg Intravenous Q6H  . diphenhydrAMINE  25 mg Oral QHS  . famotidine  20 mg Oral QHS  . fluticasone  1 spray Each Nare Daily  . heparin  5,000 Units Subcutaneous Q8H  . multivitamin with minerals  1 tablet Oral Daily   Continuous: . levETIRAcetam 500 mg (07/20/19 0129)   AYT:KZSWFUXNATFTD **OR** acetaminophen, loratadine, methocarbamol, ondansetron **OR** ondansetron (ZOFRAN) IV, ondansetron  Pertinent Labs/Diagnostics:   Ct Cervical Spine Wo Contrast  Result Date: 07/19/2019 CLINICAL DATA:  Patient found beside bed with left-sided weakness. EXAM: CT CERVICAL SPINE WITHOUT CONTRAST TECHNIQUE: Multidetector CT imaging of the cervical spine was performed without intravenous contrast. Multiplanar CT image reconstructions were also generated. COMPARISON:  MRI 01/07/2019 FINDINGS: Alignment: Normal. Skull base and vertebrae: Anterior fusion hardware intact from C3-C7. Vertebral body heights are normal. There is mild spondylosis throughout the cervical spine.  There is moderate uncovertebral joint spurring. Atlantoaxial articulation is normal. There is significant multilevel bilateral neural foraminal narrowing most prominent from the C4-5 to the C6-7 levels. No acute fracture or traumatic subluxation. Soft tissues and spinal canal: No prevertebral fluid or swelling. No visible canal hematoma. Disc levels:  Interbody fusion from C3-C7. Upper chest: No acute findings. Other: None. IMPRESSION: No acute findings. Mild spondylosis throughout the cervical spine. Significant bilateral neural foraminal narrowing at multiple levels as described. Anterior fusion hardware intact from C3-C7. Electronically Signed   By: Marin Olp M.D.   On: 07/19/2019 10:29   Mr Jeri Cos PJ Contrast  Result Date: 07/19/2019 CLINICAL DATA:  68 year old male with presentation of shaking and weakness. Masslike  abnormality on noncontrast head CT earlier today. EXAM: MRI HEAD WITHOUT AND WITH CONTRAST TECHNIQUE: Multiplanar, multiecho pulse sequences of the brain and surrounding structures were obtained without and with intravenous contrast. CONTRAST:  71mL GADAVIST GADOBUTROL 1 MMOL/ML IV SOLN COMPARISON:  Head CT at 0947 hours. FINDINGS: Brain: Within the right hemisphere there is a large masslike area of heterogeneously increased T2 and FLAIR hyperintensity with indistinct margins (series 9, image 17), T1 hypointensity. The right frontal, parietal, posterior temporal and anterior occipital lobes are affected. Following contrast only trace petechial type abnormal postcontrast enhancement seen in the right parietal region on series 18, image 43 and series 19, image 9. The abnormality encompasses 92 x 63 by 64 millimeters (AP by transverse by CC), and does appear to track into the splenium of the corpus callosum on series 9, image 16. The remaining corpus callosum appears spared. On DWI there is a heterogeneous region of partially restricted diffusion in the right parietal lobe which corresponds to the area of petechial enhancement (series 5 images 89-91). There may be trace hemosiderin in that region. Elsewhere diffusion is facilitated. Associated intracranial mass effect with 8 millimeters of leftward midline shift and partial effacement of the right lateral ventricle. Basilar cisterns remain patent. No signal abnormality or similar findings in the left hemisphere hemisphere, brainstem or cerebellum. No dural thickening. No other abnormal intracranial enhancement. No superimposed acute infarct. No restricted diffusion to suggest acute infarction. No ventriculomegaly, extra-axial collection or acute intracranial hemorrhage. Cervicomedullary junction and pituitary are within normal limits. Vascular: Major intracranial vascular flow voids are preserved. The major dural venous sinuses are enhancing and appear to be patent.  Skull and upper cervical spine: Partially visible cervical ACDF. Visualized bone marrow signal is within normal limits. Sinuses/Orbits: Negative orbits. Paranasal Visualized paranasal sinuses and mastoids are stable and well pneumatized. Other: Visible internal auditory structures appear normal. Scalp and face soft tissues appear negative. IMPRESSION: 1. Large up to 9 cm infiltrative, predominantly T2 hyperintense mass in the right hemisphere with multilobar involvement. Only trace petechial enhancement in a right parietal region where hypercellularity is suspected on diffusion imaging. Some involvement of the splenium is suspected, but no extension across midline. 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 High-grade Astrocytoma and Glioblastoma are possible. Gliomatosis cerebri is possible. CNS lymphoma is less likely. 2. Stable intracranial mass effect from earlier today. Electronically Signed   By: Genevie Ann M.D.   On: 07/19/2019 19:21   Ct Head Code Stroke Wo Contrast  Result Date: 07/19/2019 CLINICAL DATA:  Code stroke. Episode of arm shaking and weakness today. No symptoms prior to today. EXAM: CT HEAD WITHOUT CONTRAST TECHNIQUE: Contiguous axial images were obtained from the base of the skull through the vertex without intravenous  contrast. COMPARISON:  None. FINDINGS: Brain: Masslike abnormality with high-density/preserved cortex in the right frontal and parietal region with midline shift measuring 8 mm. Although subacute infarct with petechial hemorrhage is considered there were no symptoms yesterday such that mass with vasogenic edema and seizure is most likely. No hematoma or hydrocephalus. Vascular: No hyperdense vessel. Skull: Negative Sinuses/Orbits: Negative Other: These results were called by telephone at the time of interpretation on 07/19/2019 at 10:13 am to provider [facility] , who verbally acknowledged these results. ASPECTS [facility] Stroke  Program Early CT Score) Not scored in this setting IMPRESSION: Masslike abnormality centered along the high right frontal parietal lobe with vasogenic edema and 8 mm midline shift. Recommend brain MRI with contrast. Electronically Signed   By: Monte Fantasia M.D.   On: 07/19/2019 10:14   Assessment:  68/M PMH HTN who presented to [facility] with  Left sided weakness and aphasia. transferred to [facility] for further neurological evaluation. CTH: Masslike abnormality centered along the high right frontal parietal lobe with vasogenic edema and 8 mm midline shift. Was loaded with keppra at [facility]. MRI : large up to 9 cm mass in right hemisphere. Obtain EEG .   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 Other differentials high-grade astrocytoma and GBM.  Less likely CNS lymphoma  Recommendations:  --Neurosurgery consultation for possible biopsy --Neuro-oncology consult-please call Dr. Mickeal Skinner tomorrow morning. --EEG --continue Keppra 500 mg BID --dexamethasone 4 mg every 6 hours --Not inclined to perform an LP due to less likely being a CNS lymphoma.  Laurey Morale, MSN, NP-C [facility] [phone]  Attending Neurohospitalist Addendum Patient seen and examined with APP/Resident. Agree with the history and physical as documented above. Agree with the plan as documented, which I helped formulate. I have independently reviewed the chart, obtained history, review of systems and examined the patient.I have personally reviewed pertinent head/neck/spine imaging (CT/MRI). Please feel free to call with any questions. --- Amie Portland, MD [facility] Pager: [phone]  If 7pm to 7am, please call on call as listed on AMION.      07/20/2019, 8:56 AM

## 2019-07-20 NOTE — Procedures (Signed)
Patient Name: Gabriel Atkins  MRN: [id number]  Epilepsy Attending: Lora Havens  Referring Physician/Provider: Laurey Morale, NP Date: 07/20/2019 Duration: 28.58 mins  Patient history: 68yo m with right sided weakness and aphasia.   Level of alertness: awake, asleep  AEDs during EEG study: LEV  Technical aspects: This EEG study was done with scalp electrodes positioned according to the 10-20 International system of electrode placement. Electrical activity was acquired at a sampling rate of 500Hz  and reviewed with a high frequency filter of 70Hz  and a low frequency filter of 1Hz . EEG data were recorded continuously and digitally stored.   DESCRIPTION: During awake state, no clear posterior dominant rhythm was seen. Sleep was characterized by sleep spindles ( 12-14Hz ) maximal frontocentral. There was also continuous 3-5hz  theta-delta slowing in right hemisphere, maximal parieto-occipital region. Physiologic photic driving was seen during photic stimulation. Hyperventilation was not performed.    IMPRESSION: This study is suggestive of cortical dysfunction in right hemisphere, maximal right parieto-occipital region consistent with underlying mass. No seizures or epileptiform discharges were seen throughout the recording.  Mirage Pfefferkorn Barbra Sarks

## 2019-07-21 ENCOUNTER — Inpatient Hospital Stay (HOSPITAL_COMMUNITY): Payer: Medicare Other

## 2019-07-21 ENCOUNTER — Encounter (HOSPITAL_COMMUNITY): Payer: Self-pay | Admitting: Oncology

## 2019-07-21 LAB — URINE CULTURE: Culture: <10000 — AB

## 2019-07-21 LAB — HIV ANTIBODY (ROUTINE TESTING W REFLEX): HIV Screen 4th Generation wRfx: NONREACTIVE

## 2019-07-21 LAB — HEMOGLOBIN A1C
Hgb A1c MFr Bld: 6.8 % — ABNORMAL HIGH (ref 4.8–5.6)
Mean Plasma Glucose: 148 mg/dL

## 2019-07-21 MED ORDER — PROMETHAZINE HCL 25 MG/ML IJ SOLN
12.5000 mg | Freq: Four times a day (QID) | INTRAMUSCULAR | Status: DC | PRN
  Administered 2019-07-21 (×2): 12.5 mg via INTRAVENOUS
  Filled 2019-07-21 (×2): qty 1

## 2019-07-21 NOTE — Progress Notes (Signed)
PROGRESS NOTE  Gabriel Atkins  DOB: 1951-06-10  PCP: Patient, No Pcp Per CXK:[id number]  DOA: 07/19/2019  LOS: 2 days   Brief narrative: Patientis a 68 y.o.malewith PMH of HTN, arthritis, GERD and a recent anterior cervical decompression/discectomy-fusion at 4 levels from C3-C7 on 05/11/2019 by Dr. Saintclair Halsted. He has been recovering well following that surgery, but has still been having tingling and spasms in his left arm.  He presented to [facility] on 9/12 with left sided weakness and aphasia.  Patient got up on 9/12 about 0800 for coffee and was normal.Per patient he remembers having a "spell" his left arm started jerking so he sat down on the bed, and then his whole body started shaking and jerking. It was shaking and jerking so hard that he fell off the bed. EMS was called. He then doesn't remember anythingafter fallinguntil paramedics arrived. No prior seizure history. Denies any HA, visual changes.  Head CT at [facility] was consistent with right hemispheric mass and was felt to be unlikely secondary to stroke. Patient was transferred to [facility] and started on keppra and decadron.  MRI brain was obtained which showed findings most c/w infiltrating primary brain neoplasm.  Patient was admitted under hospitalist medicine service. Neurosurgery consultation was obtained.  Subjective: Patient was seen and examined this morning. Waiting for neurosurgical recommendation.  Assessment/Plan: Large right hemispheric brain mass Neurosurgery consult obtained.  Patient would like require biopsy of this lesion.  Suspicious primary bronchogenic carcinoma -Malignancy work-up was done.  CT chest showed spiculated partially cavitating 2.3 cm nodule in the left upper lobe, highly concerning for primary bronchogenic carcinoma. Consider further evaluation with tissue sampling and/or PET-CT as clinically warranted. Few prominent mediastinal and left hilar lymph nodes, nonspecific, but metastatic disease cannot be excluded.  Could be further evaluated with PET-CT. No evidence of metastatic disease within the abdomen or pelvis. -Oncology consultation called.  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  Normal at baseline.  With IV hydration, improved normal today.    Hypertension -Currently with soft blood pressure readings -Resumed metoprolol.  Keep benazepril on hold for now.    GERD -famotidine  Dyslipidemia -Continue atorvastatin -HDL 44, LDL 64.  Hyperglycemia -No prior history of diabetes noted -Pending hemoglobin A1c.  Recenthistory of cervical decompression/discectomy -Appreciate neurosurgery evaluation and follow-up  Mobility: Encourage ambulation DVT prophylaxis: Avoid heparin because of brain tumor.  SCD boots ordered Code Status:  Code Status: Full Code  Family Communication: Expected Discharge: Pending neurosurgical evaluation and planning.  Consultants:  Neurology, neurosurgery  Procedures:    Antimicrobials: Anti-infectives (From admission, onward)   None      Diet Order            Diet Heart Room service appropriate? Yes; Fluid consistency: Thin  Diet effective now              Infusions:  . levETIRAcetam 500 mg (07/21/19 0040)    Scheduled Meds: . aspirin  325 mg Oral Daily  . atorvastatin  40 mg Oral QHS  . diphenhydrAMINE  25 mg Oral QHS  . famotidine  20 mg Oral QHS  . fluticasone  1 spray Each  Nare Daily  . metoprolol tartrate  25 mg Oral BID  . multivitamin with minerals  1 tablet Oral Daily    PRN meds: acetaminophen **OR** acetaminophen, loratadine,  methocarbamol, ondansetron **OR** ondansetron (ZOFRAN) IV, ondansetron   Objective: Vitals:   07/20/19 2318 07/21/19 0926  BP: 107/60 107/68  Pulse: 76 65  Resp: 16   Temp: 98.2 F (36.8 C)   SpO2: 94%    No intake or output data in the 24 hours ending 07/21/19 1112 Filed Weights   07/19/19 1013  Weight: 83.9 kg   Weight change:  Body mass index is 27.32 kg/m.   Physical Exam: General exam: Appears calm and comfortable.  Skin: No rashes, lesions or ulcers. HEENT: Atraumatic, normocephalic, supple neck, no obvious bleeding Lungs: Clear to auscultation bilaterally CVS: Regular rate and rhythm, no murmur GI/Abd soft, nontender, nondistended, bowel sound present CNS: Alert, awake monitor x3 Psychiatry: Mood appropriate Extremities: No pedal edema, no calf tenderness  Data Review: I have personally reviewed the laboratory data and studies available.  Recent Labs  Lab 07/19/19 0950 07/20/19 0349  WBC 10.5 13.6*  NEUTROABS 4.5  --   HGB 14.3 13.0  HCT 46.2 40.0  MCV 101.5* 97.8  PLT 225 211   Recent Labs  Lab 07/19/19 0950 07/20/19 0349  NA 137 139  K 3.8 4.3  CL 105 107  CO2 15* 22  GLUCOSE 191* 168*  BUN 10 11  CREATININE 1.41* 1.12  CALCIUM 9.1 9.3    Terrilee Croak, MD  [facility] 07/21/2019

## 2019-07-21 NOTE — Progress Notes (Signed)
Patient ID: Gabriel Atkins, male   DOB: 07/23/1951, 68 y.o.   MRN: [id number] Patient doing well denies any significant headache a little bit of neck pain but also improving denies any numbness tingling weakness in his arms or legs.  MRI scan is consistent with a large low-grade glioma interestingly looks like a CT scan also may show a lung nodule with some adenopathy concerning for bronchogenic carcinoma.  Certainly would be unusual to have 2 separate cancers being diagnosed however his brain tumor does not look anything like a met.  Patient will need a stereotactic biopsy of the brain lesion he will probably also need independently biopsy or bronchoscopy for his lung.  I will look at the schedule more than likely there will not be able to do his brain biopsy to the beginning of next week. 

## 2019-07-21 NOTE — Progress Notes (Signed)
Modified Barium Swallow Progress Note  Patient Details  Name: Gabriel Atkins MRN: [id number] Date of Birth: 17-Oct-1951  Today's Date: 07/21/2019  Modified Barium Swallow completed.  Full report located under Chart Review in the Imaging Section.  Brief recommendations include the following:  Clinical Impression  Pt presents with functional oropharyngeal abilities that are c/b timely swallow initiation, good epiglottic deflection (despite hardware) and complete airway protection when consuming thin liquids, barium tablet whole and solids. Recommend continuing current diet of regular with thin liquids, medicine whole.  ST to sign off at this time. All questions answered to pt's satisfaction.    Swallow Evaluation Recommendations       SLP Diet Recommendations: Regular solids;Thin liquid   Liquid Administration via: Spoon;Cup;Straw   Medication Administration: Whole meds with liquid   Supervision: Patient able to self feed   Compensations: Minimize environmental distractions;Slow rate;Small sips/bites   Postural Changes: Seated upright at 90 degrees   Oral Care Recommendations: Oral care BID        Abbeygail Igoe 07/21/2019,8:47 AM

## 2019-07-21 NOTE — Progress Notes (Signed)
Physical Therapy Treatment Patient Details Name: Gabriel Atkins MRN: [id number] DOB: Jan 18, 1951 Today's Date: 07/21/2019    History of Present Illness Gabriel Atkins is a 68 y.o. male with medical history significant for arthritis, GERD, hypertension, and recent anterior cervical decompression/discectomy-fusion at 4 levels from C3-C7 on 05/21/2019, who was at his usual baseline when he awoke this morning to get coffee at 8 AM.  His wife then heard a fall with some left-sided weakness and aphasia. Wife noted generalized shaking. CT of head was positive for abnormal mass frontoparietal region with midline shift.     PT Comments    Patient pleasant, agrees to PT. Performs bed mobility and transfers with supervision. Ambulates 350 feet without AD, min guard. Cues for safety in hallway due to wetness on floor. Patient ambulated without O2, was on 2 lpm when arrived in room. Sats at 95%. Patient is not usually on O2 at home. Sats dropped to 89% while walking, however quickly returned to the 90%s with standing rest and deep breathing.  Patient will benefit from continued skilled PT acutely to improve activity tolerance.      Follow Up Recommendations  No PT follow up     Equipment Recommendations  None recommended by PT    Recommendations for Other Services       Precautions / Restrictions Precautions Precautions: Fall;Cervical Precaution Booklet Issued: No Precaution Comments: cervical fusion 7/20 Required Braces or Orthoses: Cervical Brace Cervical Brace: Hard collar Restrictions Weight Bearing Restrictions: No    Mobility  Bed Mobility Overal bed mobility: Independent             General bed mobility comments: needed increased time and HOB was elevated but no physical assist needed  Transfers Overall transfer level: Needs assistance Equipment used: None Transfers: Sit to/from Stand Sit to Stand: Supervision            Ambulation/Gait Ambulation/Gait assistance: Min  guard Gait Distance (Feet): 350 Feet Assistive device: None Gait Pattern/deviations: Decreased stride length     General Gait Details: gait within normal limits. no LOB, O2 sats dropped to 89% on room air with ambulation. Able to recover to low 90% with pursed lip breathing and standing rest.   Stairs             Wheelchair Mobility    Modified Rankin (Stroke Patients Only)       Balance Overall balance assessment: No apparent balance deficits (not formally assessed) Sitting-balance support: Feet supported;No upper extremity supported Sitting balance-Leahy Scale: Good     Standing balance support: No upper extremity supported Standing balance-Leahy Scale: Good                              Cognition Arousal/Alertness: Awake/alert Behavior During Therapy: WFL for tasks assessed/performed Overall Cognitive Status: Within Functional Limits for tasks assessed                                        Exercises      General Comments        Pertinent Vitals/Pain Pain Assessment: No/denies pain    Home Living                      Prior Function            PT Goals (current goals can  now be found in the care plan section) Acute Rehab PT Goals Patient Stated Goal: go home PT Goal Formulation: With patient Time For Goal Achievement: 08/02/19 Potential to Achieve Goals: Good Progress towards PT goals: Progressing toward goals    Frequency    Min 3X/week      PT Plan Current plan remains appropriate    Co-evaluation              AM-PAC PT "6 Clicks" Mobility   Outcome Measure  Help needed turning from your back to your side while in a flat bed without using bedrails?: None Help needed moving from lying on your back to sitting on the side of a flat bed without using bedrails?: None Help needed moving to and from a bed to a chair (including a wheelchair)?: A Little Help needed standing up from a chair using  your arms (e.g., wheelchair or bedside chair)?: A Little Help needed to walk in hospital room?: A Little Help needed climbing 3-5 steps with a railing? : A Little 6 Click Score: 20    End of Session Equipment Utilized During Treatment: Gait belt Activity Tolerance: Patient tolerated treatment well Patient left: in bed;with bed alarm set;with call bell/phone within reach Nurse Communication: Mobility status PT Visit Diagnosis: Muscle weakness (generalized) (M62.81)     Time: 0814-4818 PT Time Calculation (min) (ACUTE ONLY): 18 min  Charges:  $Gait Training: 8-22 mins                     Cordarious Zeek, PT, GCS 07/21/19,11:51 AM

## 2019-07-21 NOTE — Consult Note (Addendum)
Gabriel Atkins  [phone] [phone]   MEDICAL ONCOLOGY - INITIAL CONSULTATION  Referral MD: Gabriel Atkins  Reason for Referral: Brain mass and lung mass  HPI: 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  The patient had a fall the day of admission and was noted to have left-sided weakness and aphasia.  The patient does not have any recollection of these events and history was obtained from his wife.  The patient's wife noted that he had generalized shaking in his room as well as some jerking of his left arm.  A code stroke was called and a CT of the head was performed which showed a masslike abnormality in the right frontal-parietal lobe with some vasogenic edema and 8 mm midline shift.  He was started on dexamethasone and Keppra.  An MRI of the brain with and without contrast was performed which showed a large up to 9 cm infiltrative, predominantly T2 hyperintense mass in the right hemisphere with multilobar involvement.  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  High-grade astrocytoma and GBM are also considerations.  CNS lymphoma less likely.  The patient underwent initial staging CT of the chest, abdomen, pelvis with contrast which showed a spiculated partially cavitating 2.3 cm nodule in the left upper lobe of the lung which was concerning for primary bronchogenic carcinoma.  There are also a few prominent mediastinal and left hilar lymph nodes which are nonspecific but metastatic disease cannot be excluded.  There is no evidence of metastatic disease within the abdomen or pelvis.  The patient has been seen by neurosurgery who recommends a stereotactic biopsy of the brain lesion.  They are  currently working on getting this scheduled.  When seen today, the patient reports he has not had any recurrent seizures since hospital admission.  States that the numbness and weakness in his left arm has improved.  He has been able to walk with physical therapy.  Prior to admission, he did not have any anorexia or weight loss.  No night sweats.  He did not notice any headaches or dizziness.  Denies chest discomfort, shortness of breath, cough.  Denies abdominal pain, nausea, vomiting, constipation, diarrhea.  He has had persistent neck pain since surgery in July but improved.  Denies epistaxis, hemoptysis, hematemesis, hematuria, melena, hematochezia.  The patient is married and lives with his wife.  He has 1 daughter who lives in [place].  He denies prior alcohol use.  Smoked 2 packs of cigarettes for approximately 40 years.  Reports family history of throat and prostate cancer in his father.  Medical oncology was asked to the patient to make recommendations regarding his brain and lung mass.    Past Medical History:  Diagnosis Date  . Arthritis   . GERD (gastroesophageal reflux disease)   . History of hiatal hernia   . Hypertension   :  Past Surgical History:  Procedure Laterality Date  . ANTERIOR CERVICAL DECOMPRESSION/DISCECTOMY FUSION 4 LEVELS N/A 05/21/2019   Procedure: ANTERIOR CERVICAL DECOMPRESSION/DISCECTOMY FUSION - CERVICAL THREE-FOUR, CERVICAL FOUR-FIVE, CERVICAL FIVE-SIX, CERVICAL SIX-SEVEN;  Surgeon: Kary Kos, MD;  Location: [facility];  Service: Neurosurgery;  Laterality: N/A;  . CYST EXCISION     Left Groin  . SPLENECTOMY, TOTAL    :  Current Facility-Administered Medications  Medication Dose Route Frequency Provider Last Rate Last Dose  . acetaminophen (TYLENOL) tablet 650 mg  650 mg Oral Q6H PRN Heath Lark D, DO   650 mg at 07/19/19 2149   Or  . acetaminophen (TYLENOL) suppository 650 mg  650 mg Rectal Q6H PRN Heath Lark D, DO      . aspirin EC tablet 325 mg  325 mg  Oral Daily Manuella Ghazi, Pratik D, DO   325 mg at 07/22/19 4098  . atorvastatin (LIPITOR) tablet 40 mg  40 mg Oral QHS Shah, Pratik D, DO   40 mg at 07/21/19 2209  . diphenhydrAMINE (BENADRYL) tablet 25 mg  25 mg Oral QHS Shah, Pratik D, DO   25 mg at 07/21/19 2209  . famotidine (PEPCID) tablet 20 mg  20 mg Oral QHS Shah, Pratik D, DO   20 mg at 07/21/19 2209  . fluticasone (FLONASE) 50 MCG/ACT nasal spray 1 spray  1 spray Each Nare Daily Manuella Ghazi, Pratik D, DO   1 spray at 07/21/19 0923  . levETIRAcetam (KEPPRA) IVPB 500 mg/100 mL premix  500 mg Intravenous Q12H Shah, Pratik D, DO 400 mL/hr at 07/22/19 0200 500 mg at 07/22/19 0200  . loratadine (CLARITIN) tablet 10 mg  10 mg Oral Daily PRN Manuella Ghazi, Pratik D, DO      . methocarbamol (ROBAXIN) tablet 750 mg  750 mg Oral TID PRN Manuella Ghazi, Pratik D, DO   750 mg at 07/20/19 1215  . metoprolol tartrate (LOPRESSOR) tablet 25 mg  25 mg Oral BID Terrilee Croak, MD   25 mg at 07/22/19 0922  . multivitamin with minerals tablet 1 tablet  1 tablet Oral Daily Manuella Ghazi, Pratik D, DO   1 tablet at 07/22/19 1191  . ondansetron (ZOFRAN) tablet 4 mg  4 mg Oral Q6H PRN Manuella Ghazi, Pratik D, DO       Or  . ondansetron (ZOFRAN) injection 4 mg  4 mg Intravenous Q6H PRN Manuella Ghazi, Pratik D, DO      . ondansetron (ZOFRAN) injection 4 mg  4 mg Intravenous Q1H PRN Francine Graven, DO      . promethazine (PHENERGAN) injection 12.5 mg  12.5 mg Intravenous Q6H PRN Dahal, Marlowe Aschoff, MD   12.5 mg at 07/21/19 2209     Allergies  Allergen Reactions  . Ciprofloxacin     Headaches, dizziness, cold sweats    . Codeine Nausea And Vomiting    Headaches, dizziness, cold sweats    . Morphine And Related     "doesn't work"  . Latex Rash  . Oxycodone Nausea Only  . Pentazocine Nausea And Vomiting  :  History reviewed. No pertinent family history.:  Social History   Socioeconomic History  . Marital status: Married    Spouse name: Not on file  . Number of children: Not on file  . Years of education: Not  on file  . Highest education level: Not on file  Occupational History  . Not on file  Social Needs  . Financial resource strain: Not on file  . Food insecurity    Worry: Not on file    Inability: Not on file  . Transportation needs    Medical: Not on file    Non-medical: Not on file  Tobacco Use  . Smoking status: Former Smoker    Packs/day: 2.00  . Smokeless tobacco: Never Used  Substance and Sexual Activity  . Alcohol use: Not Currently  . Drug use: Never  . Sexual activity: Not on file  Lifestyle  . Physical activity    Days per week: Not on file    Minutes per session: Not on file  . Stress: Not on file  Relationships  . Social Herbalist on phone: Not on file    Gets together: Not on file    Attends religious service: Not on file    Active member of club or organization: Not on file    Attends meetings of clubs or organizations: Not on file    Relationship status: Not on file  . Intimate partner violence    Fear of current or ex partner: Not on file    Emotionally abused: Not on file    Physically abused: Not on file    Forced sexual activity: Not on file  Other Topics Concern  . Not on file  Social History Narrative  . Not on file  :   Review of systems: A comprehensive 14 point review of systems was negative except as noted in the HPI.  Exam: Patient Vitals for the past 24 hrs:  BP Temp Temp src Pulse Resp SpO2  07/22/19 0700 130/74 97.8 F (36.6 C) Oral 60 17 97 %  07/22/19 0348 128/69 98.2 F (36.8 C) Oral (!) 54 (!) 8 97 %  07/21/19 2343 107/74 98.2 F (36.8 C) Oral (!) 56 16 95 %  07/21/19 2000 132/80 98.2 F (36.8 C) Oral (!) 55 16 97 %  07/21/19 1650 111/73 (!) 97.5 F (36.4 C) Oral (!) 51 - 96 %  07/21/19 1249 106/65 97.8 F (36.6 C) Oral 60 - 97 %    General: Alert, no distress.   Eyes:  no scleral icterus.   ENT:  There were no oropharyngeal lesions.   Neck neck brace in place. Lymphatics:  Negative cervical, supraclavicular  or axillary adenopathy.   Respiratory: lungs were clear bilaterally without wheezing or crackles.   Cardiovascular:  Regular rate and rhythm, S1/S2, without murmur, rub or gallop.  There was no pedal edema.   GI:  abdomen was soft, flat, nontender, nondistended, without organomegaly.   Muscoloskeletal:  no spinal tenderness of palpation of vertebral spine.   Skin exam was without echymosis, petichae.   Neuro exam was nonfocal. Patient was alert and oriented.  Attention was good.   Language was appropriate.  Mood was normal without depression.  Speech was not pressured.  Thought content was not tangential.     Lab Results  Component Value Date   WBC 13.6 (H) 07/20/2019   HGB 13.0 07/20/2019   HCT 40.0 07/20/2019   PLT 211 07/20/2019   GLUCOSE 168 (H) 07/20/2019   CHOL 142 07/19/2019   TRIG 171 (H) 07/19/2019   HDL 44 07/19/2019   LDLCALC 64 07/19/2019   ALT 29 07/19/2019   AST 36 07/19/2019   NA 139 07/20/2019   K 4.3 07/20/2019   CL 107 07/20/2019   CREATININE 1.12 07/20/2019   BUN 11 07/20/2019   CO2 22 07/20/2019    Ct Chest W Contrast  Result Date: 07/20/2019 CLINICAL DATA:  CNS neoplasm, evaluate for primary malignancy EXAM: CT CHEST, ABDOMEN, AND PELVIS WITH CONTRAST TECHNIQUE: Multidetector CT imaging of the chest, abdomen and pelvis was performed following the standard protocol during bolus administration of intravenous contrast. CONTRAST:  193mL OMNIPAQUE IOHEXOL 300 MG/ML  SOLN COMPARISON:  CT abdomen pelvis 09/12/2016, MR thoracic spine 01/07/2019 FINDINGS: CT CHEST FINDINGS Cardiovascular: Normal heart size. No pericardial effusion. Atherosclerotic calcification of the coronary arteries. Atherosclerotic  plaque within the normal caliber aorta. Calcifications present in the proximal great vessels as well. Central pulmonary arteries are normal caliber. Mediastinum/Nodes: Few prominent lymph nodes include a in 8 mm precarinal lymph node (1/29) and a 9 mm left hilar lymph node  (1/37). No axillary adenopathy. Thyroid gland and thoracic inlet are unremarkable. Trachea is free of abnormality. Small amount of venous collateralization noted about the distal thoracic esophagus. Esophagus is otherwise unremarkable. Lungs/Pleura: There is a spiculated partially cavitating nodule in the left upper lobe measuring 2.3 x 1.9 x 1.6 cm in size. Extension of this lesion is seen to the adjacent subpleural surface and towards the major fissure. Bandlike areas of scarring and/or atelectasis are present in the left lower lobe and right middle lobe. There is mild apical predominant centrilobular emphysematous change. No other suspicious nodules or masses. Musculoskeletal: Prior anterior cervical spinal fusion. Remote compression deformity versus Schmorl's node formation noted at the T10 vertebral body. Nose acute fracture, vertebral body height loss or suspicious osseous lesion. No concerning chest wall soft tissue abnormality. CT ABDOMEN PELVIS FINDINGS Hepatobiliary: No visible or concerning hepatic lesions. Gallbladder is largely decompressed at the time of exam. No frank gallbladder wall thickening, pericholecystic fluid or inflammation or distal calcified gallstones either within the gallbladder lumen or biliary tree. Pancreas: Unremarkable. No pancreatic ductal dilatation or surrounding inflammatory changes. Spleen: Postsurgical changes from prior splenectomy with multiple surgical clips in left upper quadrant. Adrenals/Urinary Tract: Normal adrenal glands. No suspicious adrenal lesions. Mild bilateral symmetric perinephric stranding, a nonspecific finding though may correlate with either age or decreased renal function. Subcentimeter hypoattenuating focus seen in the cortex of the right inferior pole too small to fully characterize on CT imaging but statistically likely benign. Urinary bladder is distended but otherwise unremarkable. Stomach/Bowel: Distal esophagus, stomach and duodenal sweep are  unremarkable. No bowel wall thickening or dilatation. No evidence of obstruction. A normal appendix is visualized. Small surgical clip is noted near the appendiceal tip. Vascular/Lymphatic: Focal fusiform infrarenal abdominal aortic ectasia measuring up to 2.5 cm. Extensive atheromatous plaque throughout the aorta and branch vessels of the abdomen. Reproductive: The prostate and seminal vesicles are unremarkable. Other: No abdominopelvic free fluid or free gas. No bowel containing hernias. Musculoskeletal: Multilevel degenerative changes. Additional remote compression deformity versus large Schmorl's node formations are noted at the L1 and L3 levels. No suspicious osseous lesions. Suspect road remote posttraumatic deformity of the left pubic root. IMPRESSION: 1. Spiculated partially cavitating 2.3 cm nodule in the left upper lobe, highly concerning for primary bronchogenic carcinoma. Consider further evaluation with tissue sampling and/or PET-CT as clinically warranted. 2. Few prominent mediastinal and left hilar lymph nodes, nonspecific, but metastatic disease cannot be excluded. Could be further evaluated with PET-CT. 3. No evidence of metastatic disease within the abdomen or pelvis. 4. 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 Ectatic abdominal aorta at risk for aneurysm development. Recommend followup by ultrasound in 5 years. This recommendation follows ACR consensus guidelines: White Paper of the ACR Incidental Findings Committee II on Vascular Findings. J Am Coll Radiol 2013; 10:789-794. Aortic aneurysm NOS (ICD10-I71.9) 5. Few prominent paraesophageal venous collaterals. Correlate with history of variceal disease. 6. Post splenectomy. 7. Mild bilateral symmetric perinephric stranding, a nonspecific finding though may correlate with either age or decreased renal function. 8.  Emphysema (ICD10-J43.9). 9.  Aortic Atherosclerosis (ICD10-I70.0). Electronically Signed   By: Lovena Le M.D.   On: 07/20/2019 19:19   Ct Cervical Spine Wo Contrast  Result Date: 07/19/2019 CLINICAL DATA:  Patient found beside bed with left-sided weakness. EXAM: CT CERVICAL SPINE WITHOUT CONTRAST TECHNIQUE: Multidetector CT imaging of the cervical spine was performed without intravenous contrast. Multiplanar CT image reconstructions were also generated. COMPARISON:  MRI 01/07/2019 FINDINGS: Alignment: Normal. Skull base and vertebrae: Anterior fusion hardware intact from C3-C7. Vertebral body heights are normal. There is mild spondylosis throughout the cervical spine. There is moderate uncovertebral joint spurring. Atlantoaxial articulation is normal. There is significant multilevel bilateral neural foraminal narrowing most prominent from the C4-5 to the C6-7 levels. No acute fracture or traumatic subluxation. Soft tissues and spinal canal: No prevertebral fluid or swelling. No visible canal hematoma. Disc levels:  Interbody fusion from C3-C7. Upper chest: No acute findings. Other: None. IMPRESSION: No acute findings. Mild spondylosis throughout the cervical spine. Significant bilateral neural foraminal narrowing at multiple levels as described. Anterior fusion hardware intact from C3-C7. Electronically Signed   By: Marin Olp M.D.   On: 07/19/2019 10:29   Mr Jeri Cos QM Contrast  Result Date: 07/19/2019 CLINICAL DATA:  68 year old male with presentation of shaking and weakness. Masslike abnormality on noncontrast head CT earlier today. EXAM: MRI HEAD WITHOUT AND WITH CONTRAST TECHNIQUE: Multiplanar, multiecho pulse sequences of the brain and surrounding structures were obtained without and with intravenous contrast. CONTRAST:  41mL GADAVIST GADOBUTROL 1 MMOL/ML IV SOLN COMPARISON:  Head CT at 0947 hours. FINDINGS: Brain: Within the right hemisphere there is a large masslike area of heterogeneously increased T2 and FLAIR hyperintensity with indistinct margins (series 9, image 17), T1 hypointensity. The  right frontal, parietal, posterior temporal and anterior occipital lobes are affected. Following contrast only trace petechial type abnormal postcontrast enhancement seen in the right parietal region on series 18, image 43 and series 19, image 9. The abnormality encompasses 92 x 63 by 64 millimeters (AP by transverse by CC), and does appear to track into the splenium of the corpus callosum on series 9, image 16. The remaining corpus callosum appears spared. On DWI there is a heterogeneous region of partially restricted diffusion in the right parietal lobe which corresponds to the area of petechial enhancement (series 5 images 89-91). There may be trace hemosiderin in that region. Elsewhere diffusion is facilitated. Associated intracranial mass effect with 8 millimeters of leftward midline shift and partial effacement of the right lateral ventricle. Basilar cisterns remain patent. No signal abnormality or similar findings in the left hemisphere hemisphere, brainstem or cerebellum. No dural thickening. No other abnormal intracranial enhancement. No superimposed acute infarct. No restricted diffusion to suggest acute infarction. No ventriculomegaly, extra-axial collection or acute intracranial hemorrhage. Cervicomedullary junction and pituitary are within normal limits. Vascular: Major intracranial vascular flow voids are preserved. The major dural venous sinuses are enhancing and appear to be patent. Skull and upper cervical spine: Partially visible cervical ACDF. Visualized bone marrow signal is within normal limits. Sinuses/Orbits: Negative orbits. Paranasal Visualized paranasal sinuses and mastoids are stable and well pneumatized. Other: Visible internal auditory structures appear normal. Scalp and face soft tissues appear negative. IMPRESSION: 1. Large up to 9 cm infiltrative, predominantly T2 hyperintense mass in the right hemisphere with multilobar involvement. Only trace petechial enhancement in a right  parietal region where hypercellularity is suspected on diffusion imaging. Some involvement of the splenium is suspected, but no extension across midline. 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 High-grade Astrocytoma and Glioblastoma are possible. Gliomatosis cerebri is possible. CNS lymphoma is less likely. 2. Stable intracranial mass  effect from earlier today. Electronically Signed   By: Genevie Ann M.D.   On: 07/19/2019 19:21   Ct Abdomen Pelvis W Contrast  Result Date: 07/20/2019 CLINICAL DATA:  CNS neoplasm, evaluate for primary malignancy EXAM: CT CHEST, ABDOMEN, AND PELVIS WITH CONTRAST TECHNIQUE: Multidetector CT imaging of the chest, abdomen and pelvis was performed following the standard protocol during bolus administration of intravenous contrast. CONTRAST:  168mL OMNIPAQUE IOHEXOL 300 MG/ML  SOLN COMPARISON:  CT abdomen pelvis 09/12/2016, MR thoracic spine 01/07/2019 FINDINGS: CT CHEST FINDINGS Cardiovascular: Normal heart size. No pericardial effusion. Atherosclerotic calcification of the coronary arteries. Atherosclerotic plaque within the normal caliber aorta. Calcifications present in the proximal great vessels as well. Central pulmonary arteries are normal caliber. Mediastinum/Nodes: Few prominent lymph nodes include a in 8 mm precarinal lymph node (1/29) and a 9 mm left hilar lymph node (1/37). No axillary adenopathy. Thyroid gland and thoracic inlet are unremarkable. Trachea is free of abnormality. Small amount of venous collateralization noted about the distal thoracic esophagus. Esophagus is otherwise unremarkable. Lungs/Pleura: There is a spiculated partially cavitating nodule in the left upper lobe measuring 2.3 x 1.9 x 1.6 cm in size. Extension of this lesion is seen to the adjacent subpleural surface and towards the major fissure. Bandlike areas of scarring and/or atelectasis are present in the left lower lobe and right middle lobe. There is mild  apical predominant centrilobular emphysematous change. No other suspicious nodules or masses. Musculoskeletal: Prior anterior cervical spinal fusion. Remote compression deformity versus Schmorl's node formation noted at the T10 vertebral body. Nose acute fracture, vertebral body height loss or suspicious osseous lesion. No concerning chest wall soft tissue abnormality. CT ABDOMEN PELVIS FINDINGS Hepatobiliary: No visible or concerning hepatic lesions. Gallbladder is largely decompressed at the time of exam. No frank gallbladder wall thickening, pericholecystic fluid or inflammation or distal calcified gallstones either within the gallbladder lumen or biliary tree. Pancreas: Unremarkable. No pancreatic ductal dilatation or surrounding inflammatory changes. Spleen: Postsurgical changes from prior splenectomy with multiple surgical clips in left upper quadrant. Adrenals/Urinary Tract: Normal adrenal glands. No suspicious adrenal lesions. Mild bilateral symmetric perinephric stranding, a nonspecific finding though may correlate with either age or decreased renal function. Subcentimeter hypoattenuating focus seen in the cortex of the right inferior pole too small to fully characterize on CT imaging but statistically likely benign. Urinary bladder is distended but otherwise unremarkable. Stomach/Bowel: Distal esophagus, stomach and duodenal sweep are unremarkable. No bowel wall thickening or dilatation. No evidence of obstruction. A normal appendix is visualized. Small surgical clip is noted near the appendiceal tip. Vascular/Lymphatic: Focal fusiform infrarenal abdominal aortic ectasia measuring up to 2.5 cm. Extensive atheromatous plaque throughout the aorta and branch vessels of the abdomen. Reproductive: The prostate and seminal vesicles are unremarkable. Other: No abdominopelvic free fluid or free gas. No bowel containing hernias. Musculoskeletal: Multilevel degenerative changes. Additional remote compression  deformity versus large Schmorl's node formations are noted at the L1 and L3 levels. No suspicious osseous lesions. Suspect road remote posttraumatic deformity of the left pubic root. IMPRESSION: 1. Spiculated partially cavitating 2.3 cm nodule in the left upper lobe, highly concerning for primary bronchogenic carcinoma. Consider further evaluation with tissue sampling and/or PET-CT as clinically warranted. 2. Few prominent mediastinal and left hilar lymph nodes, nonspecific, but metastatic disease cannot be excluded. Could be further evaluated with PET-CT. 3. No evidence of metastatic disease within the abdomen or pelvis. 4. 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 Ectatic abdominal aorta  at risk for aneurysm development. Recommend followup by ultrasound in 5 years. This recommendation follows ACR consensus guidelines: White Paper of the ACR Incidental Findings Committee II on Vascular Findings. J Am Coll Radiol 2013; 10:789-794. Aortic aneurysm NOS (ICD10-I71.9) 5. Few prominent paraesophageal venous collaterals. Correlate with history of variceal disease. 6. Post splenectomy. 7. Mild bilateral symmetric perinephric stranding, a nonspecific finding though may correlate with either age or decreased renal function. 8.  Emphysema (ICD10-J43.9). 9.  Aortic Atherosclerosis (ICD10-I70.0). Electronically Signed   By: Lovena Le M.D.   On: 07/20/2019 19:19   Dg Swallowing Func-speech Pathology  Result Date: 07/21/2019 Objective Swallowing Evaluation: Type of Study: MBS-Modified Barium Swallow Study  Patient Details Name: Gabriel Atkins MRN: [id number] Date of Birth: 07/11/1951 Today's Date: 07/21/2019 Time: SLP Start Time (ACUTE ONLY): 0932 -SLP Stop Time (ACUTE ONLY): 0947 SLP Time Calculation (min) (ACUTE ONLY): 15 min Past Medical History: Past Medical History: Diagnosis Date . Arthritis  . GERD (gastroesophageal reflux disease)  . History of hiatal hernia  . Hypertension  Past  Surgical History: Past Surgical History: Procedure Laterality Date . ANTERIOR CERVICAL DECOMPRESSION/DISCECTOMY FUSION 4 LEVELS N/A 05/21/2019  Procedure: ANTERIOR CERVICAL DECOMPRESSION/DISCECTOMY FUSION - CERVICAL THREE-FOUR, CERVICAL FOUR-FIVE, CERVICAL FIVE-SIX, CERVICAL SIX-SEVEN;  Surgeon: Kary Kos, MD;  Location: [facility];  Service: Neurosurgery;  Laterality: N/A; . CYST EXCISION    Left Groin . SPLENECTOMY, TOTAL   HPI: Gabriel Atkins is a 68 y.o. male with medical history significant for arthritis, GERD, hypertension, and recent anterior cervical decompression/discectomy-fusion at 4 levels from C3-C7 on 05/21/2019, who was at his usual baseline when he awoke this morning to get coffee at 8 AM.  His wife then heard a fall associated with some left-sided weakness and aphasia.  He then does not recall what happened, but according to the wife, he was noted to have some generalized shaking as well as some jerking of his left arm.  The next thing the patient recalls is EMS arriving at his home.  Apparently, his wife had called the neurosurgeon on call, Dr. Vertell Limber, who had recommended that the patient come to the ED.  The patient has had improvement in his aphasia and is able to give me a history at this time.  He denies any headache, visual changes.  He states that his weakness is intermittent, but improving.  He is right-handed.  MRI of the head was completed and showing a large up to 9 cm infiltrative, predominantly T2 hyperintense mass in the right hemisphere with multilobar involvement. Only trace  Subjective: alert, pleasant, conversant Assessment / Plan / Recommendation CHL IP CLINICAL IMPRESSIONS 07/21/2019 Clinical Impression Pt presents with functional oropharyngeal abilities that are c/b timely swallow initiation, good epiglottic deflection (despite hardware) and complete airway protection when consuming thin liquids, barium tablet whole and solids. Recommend continuing current diet of regular with thin  liquids, medicine whole.  SLP Visit Diagnosis Dysphagia, unspecified (R13.10) Attention and concentration deficit following -- Frontal lobe and executive function deficit following -- Impact on safety and function No limitations   CHL IP TREATMENT RECOMMENDATION 07/21/2019 Treatment Recommendations No treatment recommended at this time   No flowsheet data found. CHL IP DIET RECOMMENDATION 07/21/2019 SLP Diet Recommendations Regular solids;Thin liquid Liquid Administration via Spoon;Cup;Straw Medication Administration Whole meds with liquid Compensations Minimize environmental distractions;Slow rate;Small sips/bites Postural Changes Seated upright at 90 degrees   CHL IP OTHER RECOMMENDATIONS 07/21/2019 Recommended Consults -- Oral Care Recommendations Oral care BID Other Recommendations --  CHL IP FOLLOW UP RECOMMENDATIONS 07/20/2019 Follow up Recommendations Other (comment)   No flowsheet data found.     CHL IP ORAL PHASE 07/21/2019 Oral Phase WFL Oral - Pudding Teaspoon -- Oral - Pudding Cup -- Oral - Honey Teaspoon -- Oral - Honey Cup -- Oral - Nectar Teaspoon -- Oral - Nectar Cup -- Oral - Nectar Straw -- Oral - Thin Teaspoon -- Oral - Thin Cup -- Oral - Thin Straw -- Oral - Puree -- Oral - Mech Soft -- Oral - Regular -- Oral - Multi-Consistency -- Oral - Pill -- Oral Phase - Comment --  CHL IP PHARYNGEAL PHASE 07/21/2019 Pharyngeal Phase WFL Pharyngeal- Pudding Teaspoon -- Pharyngeal -- Pharyngeal- Pudding Cup -- Pharyngeal -- Pharyngeal- Honey Teaspoon -- Pharyngeal -- Pharyngeal- Honey Cup -- Pharyngeal -- Pharyngeal- Nectar Teaspoon -- Pharyngeal -- Pharyngeal- Nectar Cup -- Pharyngeal -- Pharyngeal- Nectar Straw -- Pharyngeal -- Pharyngeal- Thin Teaspoon -- Pharyngeal -- Pharyngeal- Thin Cup -- Pharyngeal -- Pharyngeal- Thin Straw -- Pharyngeal -- Pharyngeal- Puree -- Pharyngeal -- Pharyngeal- Mechanical Soft -- Pharyngeal -- Pharyngeal- Regular -- Pharyngeal -- Pharyngeal- Multi-consistency -- Pharyngeal --  Pharyngeal- Pill -- Pharyngeal -- Pharyngeal Comment --  CHL IP CERVICAL ESOPHAGEAL PHASE 07/21/2019 Cervical Esophageal Phase WFL Pudding Teaspoon -- Pudding Cup -- Honey Teaspoon -- Honey Cup -- Nectar Teaspoon -- Nectar Cup -- Nectar Straw -- Thin Teaspoon -- Thin Cup -- Thin Straw -- Puree -- Mechanical Soft -- Regular -- Multi-consistency -- Pill -- Cervical Esophageal Comment -- Gabriel Atkins 07/21/2019, 8:48 AM              Ct Head Code Stroke Wo Contrast  Result Date: 07/19/2019 CLINICAL DATA:  Code stroke. Episode of arm shaking and weakness today. No symptoms prior to today. EXAM: CT HEAD WITHOUT CONTRAST TECHNIQUE: Contiguous axial images were obtained from the base of the skull through the vertex without intravenous contrast. COMPARISON:  None. FINDINGS: Brain: Masslike abnormality with high-density/preserved cortex in the right frontal and parietal region with midline shift measuring 8 mm. Although subacute infarct with petechial hemorrhage is considered there were no symptoms yesterday such that mass with vasogenic edema and seizure is most likely. No hematoma or hydrocephalus. Vascular: No hyperdense vessel. Skull: Negative Sinuses/Orbits: Negative Other: These results were called by telephone at the time of interpretation on 07/19/2019 at 10:13 am to provider [facility] , who verbally acknowledged these results. ASPECTS [facility] Stroke Program Early CT Score) Not scored in this setting IMPRESSION: Masslike abnormality centered along the high right frontal parietal lobe with vasogenic edema and 8 mm midline shift. Recommend brain MRI with contrast. Electronically Signed   By: Monte Fantasia M.D.   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 CONTRAST:  174mL OMNIPAQUE IOHEXOL 300 MG/ML  SOLN COMPARISON:  CT abdomen pelvis 09/12/2016, MR thoracic spine 01/07/2019 FINDINGS: CT CHEST FINDINGS Cardiovascular: Normal heart size. No pericardial effusion. Atherosclerotic calcification of the coronary arteries. Atherosclerotic plaque within the normal caliber aorta. Calcifications present in the proximal great vessels as well. Central pulmonary arteries are normal caliber. Mediastinum/Nodes: Few prominent lymph nodes include a in 8 mm precarinal lymph node (1/29) and a 9 mm left hilar lymph node (1/37). No axillary adenopathy. Thyroid  gland and thoracic inlet are unremarkable. Trachea is free of abnormality. Small amount of venous collateralization noted about the distal thoracic esophagus. Esophagus is otherwise unremarkable. Lungs/Pleura: There is a spiculated partially cavitating nodule in the left upper lobe measuring 2.3 x 1.9 x 1.6 cm in size. Extension of this lesion is seen to the adjacent subpleural surface and towards the major fissure. Bandlike areas of scarring and/or atelectasis are present in the left lower lobe and right middle lobe. There is mild apical predominant centrilobular emphysematous change. No other suspicious nodules or masses. Musculoskeletal: Prior anterior cervical spinal fusion. Remote compression deformity versus Schmorl's node formation noted at the T10 vertebral body. Nose acute fracture, vertebral body height loss or suspicious osseous lesion. No concerning chest wall soft tissue abnormality. CT ABDOMEN PELVIS FINDINGS Hepatobiliary: No visible or concerning hepatic lesions. Gallbladder is largely decompressed at the time of exam. No frank gallbladder wall thickening, pericholecystic fluid or inflammation or distal calcified gallstones either within the gallbladder lumen or biliary tree. Pancreas: Unremarkable. No pancreatic ductal dilatation or surrounding inflammatory  changes. Spleen: Postsurgical changes from prior splenectomy with multiple surgical clips in left upper quadrant. Adrenals/Urinary Tract: Normal adrenal glands. No suspicious adrenal lesions. Mild bilateral symmetric perinephric stranding, a nonspecific finding though may correlate with either age or decreased renal function. Subcentimeter hypoattenuating focus seen in the cortex of the right inferior pole too small to fully characterize on CT imaging but statistically likely benign. Urinary bladder is distended but otherwise unremarkable. Stomach/Bowel: Distal esophagus, stomach and duodenal sweep are unremarkable. No bowel wall thickening or dilatation. No evidence of obstruction. A normal appendix is visualized. Small surgical clip is noted near the appendiceal tip. Vascular/Lymphatic: Focal fusiform infrarenal abdominal aortic ectasia measuring up to 2.5 cm. Extensive atheromatous plaque throughout the aorta and branch vessels of the abdomen. Reproductive: The prostate and seminal vesicles are unremarkable. Other: No abdominopelvic free fluid or free gas. No bowel containing hernias. Musculoskeletal: Multilevel degenerative changes. Additional remote compression deformity versus large Schmorl's node formations are noted at the L1 and L3 levels. No suspicious osseous lesions. Suspect road remote posttraumatic deformity of the left pubic root. IMPRESSION: 1. Spiculated partially cavitating 2.3 cm nodule in the left upper lobe, highly concerning for primary bronchogenic carcinoma. Consider further evaluation with tissue sampling and/or PET-CT as clinically warranted. 2. Few prominent mediastinal and left hilar lymph nodes, nonspecific, but metastatic disease cannot be excluded. Could be further evaluated with PET-CT. 3. No evidence of metastatic disease within the abdomen or pelvis. 4. 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 Ectatic abdominal aorta at risk for aneurysm  development. Recommend followup by ultrasound in 5 years. This recommendation follows ACR consensus guidelines: White Paper of the ACR Incidental Findings Committee II on Vascular Findings. J Am Coll Radiol 2013; 10:789-794. Aortic aneurysm NOS (ICD10-I71.9) 5. Few prominent paraesophageal venous collaterals. Correlate with history of variceal disease. 6. Post splenectomy. 7. Mild bilateral symmetric perinephric stranding, a nonspecific finding though may correlate with either age or decreased renal function. 8.  Emphysema (ICD10-J43.9). 9.  Aortic Atherosclerosis (ICD10-I70.0). Electronically Signed   By: Lovena Le M.D.   On: 07/20/2019 19:19   Ct Cervical Spine Wo Contrast  Result Date: 07/19/2019 CLINICAL DATA:  Patient found beside bed with left-sided weakness. EXAM: CT CERVICAL SPINE WITHOUT CONTRAST TECHNIQUE: Multidetector CT imaging of the cervical spine was performed without intravenous contrast. Multiplanar CT image reconstructions were also generated. COMPARISON:  MRI 01/07/2019 FINDINGS: Alignment: Normal. Skull base  and vertebrae: Anterior fusion hardware intact from C3-C7. Vertebral body heights are normal. There is mild spondylosis throughout the cervical spine. There is moderate uncovertebral joint spurring. Atlantoaxial articulation is normal. There is significant multilevel bilateral neural foraminal narrowing most prominent from the C4-5 to the C6-7 levels. No acute fracture or traumatic subluxation. Soft tissues and spinal canal: No prevertebral fluid or swelling. No visible canal hematoma. Disc levels:  Interbody fusion from C3-C7. Upper chest: No acute findings. Other: None. IMPRESSION: No acute findings. Mild spondylosis throughout the cervical spine. Significant bilateral neural foraminal narrowing at multiple levels as described. Anterior fusion hardware intact from C3-C7. Electronically Signed   By: Marin Olp M.D.   On: 07/19/2019 10:29   Mr Jeri Cos ZO Contrast  Result Date:  07/19/2019 CLINICAL DATA:  68 year old male with presentation of shaking and weakness. Masslike abnormality on noncontrast head CT earlier today. EXAM: MRI HEAD WITHOUT AND WITH CONTRAST TECHNIQUE: Multiplanar, multiecho pulse sequences of the brain and surrounding structures were obtained without and with intravenous contrast. CONTRAST:  29mL GADAVIST GADOBUTROL 1 MMOL/ML IV SOLN COMPARISON:  Head CT at 0947 hours. FINDINGS: Brain: Within the right hemisphere there is a large masslike area of heterogeneously increased T2 and FLAIR hyperintensity with indistinct margins (series 9, image 17), T1 hypointensity. The right frontal, parietal, posterior temporal and anterior occipital lobes are affected. Following contrast only trace petechial type abnormal postcontrast enhancement seen in the right parietal region on series 18, image 43 and series 19, image 9. The abnormality encompasses 92 x 63 by 64 millimeters (AP by transverse by CC), and does appear to track into the splenium of the corpus callosum on series 9, image 16. The remaining corpus callosum appears spared. On DWI there is a heterogeneous region of partially restricted diffusion in the right parietal lobe which corresponds to the area of petechial enhancement (series 5 images 89-91). There may be trace hemosiderin in that region. Elsewhere diffusion is facilitated. Associated intracranial mass effect with 8 millimeters of leftward midline shift and partial effacement of the right lateral ventricle. Basilar cisterns remain patent. No signal abnormality or similar findings in the left hemisphere hemisphere, brainstem or cerebellum. No dural thickening. No other abnormal intracranial enhancement. No superimposed acute infarct. No restricted diffusion to suggest acute infarction. No ventriculomegaly, extra-axial collection or acute intracranial hemorrhage. Cervicomedullary junction and pituitary are within normal limits. Vascular: Major intracranial vascular  flow voids are preserved. The major dural venous sinuses are enhancing and appear to be patent. Skull and upper cervical spine: Partially visible cervical ACDF. Visualized bone marrow signal is within normal limits. Sinuses/Orbits: Negative orbits. Paranasal Visualized paranasal sinuses and mastoids are stable and well pneumatized. Other: Visible internal auditory structures appear normal. Scalp and face soft tissues appear negative. IMPRESSION: 1. Large up to 9 cm infiltrative, predominantly T2 hyperintense mass in the right hemisphere with multilobar involvement. Only trace petechial enhancement in a right parietal region where hypercellularity is suspected on diffusion imaging. Some involvement of the splenium is suspected, but no extension across midline. 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 High-grade Astrocytoma and Glioblastoma are possible. Gliomatosis cerebri is possible. CNS lymphoma is less likely. 2. Stable intracranial mass effect from earlier today. Electronically Signed   By: Genevie Ann M.D.   On: 07/19/2019 19:21   Ct Abdomen Pelvis W Contrast  Result Date: 07/20/2019 CLINICAL DATA:  CNS neoplasm, evaluate for primary malignancy EXAM: CT CHEST, ABDOMEN, AND PELVIS WITH CONTRAST TECHNIQUE: Multidetector  CT imaging of the chest, abdomen and pelvis was performed following the standard protocol during bolus administration of intravenous contrast. CONTRAST:  148mL OMNIPAQUE IOHEXOL 300 MG/ML  SOLN COMPARISON:  CT abdomen pelvis 09/12/2016, MR thoracic spine 01/07/2019 FINDINGS: CT CHEST FINDINGS Cardiovascular: Normal heart size. No pericardial effusion. Atherosclerotic calcification of the coronary arteries. Atherosclerotic plaque within the normal caliber aorta. Calcifications present in the proximal great vessels as well. Central pulmonary arteries are normal caliber. Mediastinum/Nodes: Few prominent lymph nodes include a in 8 mm precarinal lymph node  (1/29) and a 9 mm left hilar lymph node (1/37). No axillary adenopathy. Thyroid gland and thoracic inlet are unremarkable. Trachea is free of abnormality. Small amount of venous collateralization noted about the distal thoracic esophagus. Esophagus is otherwise unremarkable. Lungs/Pleura: There is a spiculated partially cavitating nodule in the left upper lobe measuring 2.3 x 1.9 x 1.6 cm in size. Extension of this lesion is seen to the adjacent subpleural surface and towards the major fissure. Bandlike areas of scarring and/or atelectasis are present in the left lower lobe and right middle lobe. There is mild apical predominant centrilobular emphysematous change. No other suspicious nodules or masses. Musculoskeletal: Prior anterior cervical spinal fusion. Remote compression deformity versus Schmorl's node formation noted at the T10 vertebral body. Nose acute fracture, vertebral body height loss or suspicious osseous lesion. No concerning chest wall soft tissue abnormality. CT ABDOMEN PELVIS FINDINGS Hepatobiliary: No visible or concerning hepatic lesions. Gallbladder is largely decompressed at the time of exam. No frank gallbladder wall thickening, pericholecystic fluid or inflammation or distal calcified gallstones either within the gallbladder lumen or biliary tree. Pancreas: Unremarkable. No pancreatic ductal dilatation or surrounding inflammatory changes. Spleen: Postsurgical changes from prior splenectomy with multiple surgical clips in left upper quadrant. Adrenals/Urinary Tract: Normal adrenal glands. No suspicious adrenal lesions. Mild bilateral symmetric perinephric stranding, a nonspecific finding though may correlate with either age or decreased renal function. Subcentimeter hypoattenuating focus seen in the cortex of the right inferior pole too small to fully characterize on CT imaging but statistically likely benign. Urinary bladder is distended but otherwise unremarkable. Stomach/Bowel: Distal  esophagus, stomach and duodenal sweep are unremarkable. No bowel wall thickening or dilatation. No evidence of obstruction. A normal appendix is visualized. Small surgical clip is noted near the appendiceal tip. Vascular/Lymphatic: Focal fusiform infrarenal abdominal aortic ectasia measuring up to 2.5 cm. Extensive atheromatous plaque throughout the aorta and branch vessels of the abdomen. Reproductive: The prostate and seminal vesicles are unremarkable. Other: No abdominopelvic free fluid or free gas. No bowel containing hernias. Musculoskeletal: Multilevel degenerative changes. Additional remote compression deformity versus large Schmorl's node formations are noted at the L1 and L3 levels. No suspicious osseous lesions. Suspect road remote posttraumatic deformity of the left pubic root. IMPRESSION: 1. Spiculated partially cavitating 2.3 cm nodule in the left upper lobe, highly concerning for primary bronchogenic carcinoma. Consider further evaluation with tissue sampling and/or PET-CT as clinically warranted. 2. Few prominent mediastinal and left hilar lymph nodes, nonspecific, but metastatic disease cannot be excluded. Could be further evaluated with PET-CT. 3. No evidence of metastatic disease within the abdomen or pelvis. 4. 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 Ectatic abdominal aorta at risk for aneurysm development. Recommend followup by ultrasound in 5 years. This recommendation follows ACR consensus guidelines: White Paper of the ACR Incidental Findings Committee II on Vascular Findings. J Am Coll Radiol 2013; 10:789-794. Aortic aneurysm NOS (ICD10-I71.9) 5. Few prominent paraesophageal venous collaterals. Correlate with  history of variceal disease. 6. Post splenectomy. 7. Mild bilateral symmetric perinephric stranding, a nonspecific finding though may correlate with either age or decreased renal function. 8.  Emphysema (ICD10-J43.9). 9.  Aortic Atherosclerosis  (ICD10-I70.0). Electronically Signed   By: Lovena Le M.D.   On: 07/20/2019 19:19   Dg Swallowing Func-speech Pathology  Result Date: 07/21/2019 Objective Swallowing Evaluation: Type of Study: MBS-Modified Barium Swallow Study  Patient Details Name: Gabriel Atkins MRN: [id number] Date of Birth: 1950-12-27 Today's Date: 07/21/2019 Time: SLP Start Time (ACUTE ONLY): 0932 -SLP Stop Time (ACUTE ONLY): 0947 SLP Time Calculation (min) (ACUTE ONLY): 15 min Past Medical History: Past Medical History: Diagnosis Date . Arthritis  . GERD (gastroesophageal reflux disease)  . History of hiatal hernia  . Hypertension  Past Surgical History: Past Surgical History: Procedure Laterality Date . ANTERIOR CERVICAL DECOMPRESSION/DISCECTOMY FUSION 4 LEVELS N/A 05/21/2019  Procedure: ANTERIOR CERVICAL DECOMPRESSION/DISCECTOMY FUSION - CERVICAL THREE-FOUR, CERVICAL FOUR-FIVE, CERVICAL FIVE-SIX, CERVICAL SIX-SEVEN;  Surgeon: Kary Kos, MD;  Location: [facility];  Service: Neurosurgery;  Laterality: N/A; . CYST EXCISION    Left Groin . SPLENECTOMY, TOTAL   HPI: Gabriel Atkins is a 68 y.o. male with medical history significant for arthritis, GERD, hypertension, and recent anterior cervical decompression/discectomy-fusion at 4 levels from C3-C7 on 05/21/2019, who was at his usual baseline when he awoke this morning to get coffee at 8 AM.  His wife then heard a fall associated with some left-sided weakness and aphasia.  He then does not recall what happened, but according to the wife, he was noted to have some generalized shaking as well as some jerking of his left arm.  The next thing the patient recalls is EMS arriving at his home.  Apparently, his wife had called the neurosurgeon on call, Dr. Vertell Limber, who had recommended that the patient come to the ED.  The patient has had improvement in his aphasia and is able to give me a history at this time.  He denies any headache, visual changes.  He states that his weakness is intermittent, but improving.   He is right-handed.  MRI of the head was completed and showing a large up to 9 cm infiltrative, predominantly T2 hyperintense mass in the right hemisphere with multilobar involvement. Only trace  Subjective: alert, pleasant, conversant Assessment / Plan / Recommendation CHL IP CLINICAL IMPRESSIONS 07/21/2019 Clinical Impression Pt presents with functional oropharyngeal abilities that are c/b timely swallow initiation, good epiglottic deflection (despite hardware) and complete airway protection when consuming thin liquids, barium tablet whole and solids. Recommend continuing current diet of regular with thin liquids, medicine whole.  SLP Visit Diagnosis Dysphagia, unspecified (R13.10) Attention and concentration deficit following -- Frontal lobe and executive function deficit following -- Impact on safety and function No limitations   CHL IP TREATMENT RECOMMENDATION 07/21/2019 Treatment Recommendations No treatment recommended at this time   No flowsheet data found. CHL IP DIET RECOMMENDATION 07/21/2019 SLP Diet Recommendations Regular solids;Thin liquid Liquid Administration via Spoon;Cup;Straw Medication Administration Whole meds with liquid Compensations Minimize environmental distractions;Slow rate;Small sips/bites Postural Changes Seated upright at 90 degrees   CHL IP OTHER RECOMMENDATIONS 07/21/2019 Recommended Consults -- Oral Care Recommendations Oral care BID Other Recommendations --   CHL IP FOLLOW UP RECOMMENDATIONS 07/20/2019 Follow up Recommendations Other (comment)   No flowsheet data found.     CHL IP ORAL PHASE 07/21/2019 Oral Phase WFL Oral - Pudding Teaspoon -- Oral - Pudding Cup -- Oral - Honey Teaspoon -- Oral - Honey Cup --  Oral - Nectar Teaspoon -- Oral - Nectar Cup -- Oral - Nectar Straw -- Oral - Thin Teaspoon -- Oral - Thin Cup -- Oral - Thin Straw -- Oral - Puree -- Oral - Mech Soft -- Oral - Regular -- Oral - Multi-Consistency -- Oral - Pill -- Oral Phase - Comment --  CHL IP PHARYNGEAL PHASE  07/21/2019 Pharyngeal Phase WFL Pharyngeal- Pudding Teaspoon -- Pharyngeal -- Pharyngeal- Pudding Cup -- Pharyngeal -- Pharyngeal- Honey Teaspoon -- Pharyngeal -- Pharyngeal- Honey Cup -- Pharyngeal -- Pharyngeal- Nectar Teaspoon -- Pharyngeal -- Pharyngeal- Nectar Cup -- Pharyngeal -- Pharyngeal- Nectar Straw -- Pharyngeal -- Pharyngeal- Thin Teaspoon -- Pharyngeal -- Pharyngeal- Thin Cup -- Pharyngeal -- Pharyngeal- Thin Straw -- Pharyngeal -- Pharyngeal- Puree -- Pharyngeal -- Pharyngeal- Mechanical Soft -- Pharyngeal -- Pharyngeal- Regular -- Pharyngeal -- Pharyngeal- Multi-consistency -- Pharyngeal -- Pharyngeal- Pill -- Pharyngeal -- Pharyngeal Comment --  CHL IP CERVICAL ESOPHAGEAL PHASE 07/21/2019 Cervical Esophageal Phase WFL Pudding Teaspoon -- Pudding Cup -- Honey Teaspoon -- Honey Cup -- Nectar Teaspoon -- Nectar Cup -- Nectar Straw -- Thin Teaspoon -- Thin Cup -- Thin Straw -- Puree -- Mechanical Soft -- Regular -- Multi-consistency -- Pill -- Cervical Esophageal Comment -- Gabriel Atkins 07/21/2019, 8:48 AM              Ct Head Code Stroke Wo Contrast  Result Date: 07/19/2019 CLINICAL DATA:  Code stroke. Episode of arm shaking and weakness today. No symptoms prior to today. EXAM: CT HEAD WITHOUT CONTRAST TECHNIQUE: Contiguous axial images were obtained from the base of the skull through the vertex without intravenous contrast. COMPARISON:  None. FINDINGS: Brain: Masslike abnormality with high-density/preserved cortex in the right frontal and parietal region with midline shift measuring 8 mm. Although subacute infarct with petechial hemorrhage is considered there were no symptoms yesterday such that mass with vasogenic edema and seizure is most likely. No hematoma or hydrocephalus. Vascular: No hyperdense vessel. Skull: Negative Sinuses/Orbits: Negative Other: These results were called by telephone at the time of interpretation on 07/19/2019 at 10:13 am to provider [facility] , who verbally  acknowledged these results. ASPECTS [facility] Stroke Program Early CT Score) Not scored in this setting IMPRESSION: Masslike abnormality centered along the high right frontal parietal lobe with vasogenic edema and 8 mm midline shift. Recommend brain MRI with contrast. Electronically Signed   By: Monte Fantasia M.D.   On: 07/19/2019 10:14    Assessment and Plan:  This is a 68 year old male with  1.  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 CNS lymphoma or metastatic disease also a consideration, but felt to be less likely.  -Neurosurgery has been consulted and plans for stereotactic biopsy of his brain lesion -We will await final biopsy result before making further recommendations -Will likely need a neuro oncology consult as well as radiation oncology consult once pathology obtained  2.  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 -Has an approximate 80-pack-year smoking history -Lung mass suspicious for primary lung cancer -He will need a pulmonology consult for possible bronchoscopy at some point in the near future.  We can time this to be performed after his brain biopsy is complete. -Further recommendations pending biopsy results.  3.  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 -He has not had any recurrent seizures -On Keppra  4.  Hypertension -Blood pressure currently controlled on metoprolol -Ongoing management per hospitalist   Gabriel Bussing, DNP, AGPCNP-BC, AOCNP  ADDENDUM  Patient was Personally and independently interviewed, examined  and relevant elements of the history of present illness were reviewed in details and an assessment and plan was created. All elements of the patient's history of present illness , assessment and plan were discussed in details with Gabriel Bussing, DNP and hospitalist Dr Pietro Cassis. The above documentation reflects our combined findings assessment and plan.  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  CNS lymphoma is less likely.  The 2.3 cm left upper lobe lung mass is concerning for separate lung primary carcinoma with some possible mediastinal and left hilar lymphadenopathy.  PLAN -The patient's overall prognosis and clinical priority will be primarily determined by the etiology of his massive right parietal brain mass.  The definitive diagnosis of this will drive the decision about other work-up and management. -He has been scheduled for outpatient right parietal mass stereotactic biopsy on 07/30/2019 with Dr. Saintclair Halsted. -Steroids and seizure prophylaxis as per neurosurgery. -He will also need outpatient neuro-oncology follow-up which we shall help arrange. -Once the results of the stereotactic brain biopsy are available and help determine the prognosis will determine the extent of evaluation and management of his pulmonary malignancy. -We shall set up a follow-up with [place] in 2 weeks in oncology clinic with Dr. Irene Limbo. -Appreciate the excellent cares by the hospitalist and neurosurgery teams.  Gabriel Lone MD MS

## 2019-07-22 ENCOUNTER — Encounter (HOSPITAL_COMMUNITY): Payer: Self-pay | Admitting: General Practice

## 2019-07-22 ENCOUNTER — Other Ambulatory Visit: Payer: Self-pay

## 2019-07-22 DIAGNOSIS — Z808 Family history of malignant neoplasm of other organs or systems: Secondary | ICD-10-CM

## 2019-07-22 DIAGNOSIS — I10 Essential (primary) hypertension: Secondary | ICD-10-CM

## 2019-07-22 DIAGNOSIS — R911 Solitary pulmonary nodule: Secondary | ICD-10-CM

## 2019-07-22 DIAGNOSIS — C719 Malignant neoplasm of brain, unspecified: Secondary | ICD-10-CM

## 2019-07-22 DIAGNOSIS — Z8042 Family history of malignant neoplasm of prostate: Secondary | ICD-10-CM

## 2019-07-22 DIAGNOSIS — IMO0001 Reserved for inherently not codable concepts without codable children: Secondary | ICD-10-CM

## 2019-07-22 DIAGNOSIS — R918 Other nonspecific abnormal finding of lung field: Secondary | ICD-10-CM

## 2019-07-22 DIAGNOSIS — Z87891 Personal history of nicotine dependence: Secondary | ICD-10-CM

## 2019-07-22 MED ORDER — DEXAMETHASONE 4 MG PO TABS: 4.0000 mg | ORAL_TABLET | Freq: Two times a day (BID) | ORAL | 0 refills | Status: DC

## 2019-07-22 MED ORDER — DEXAMETHASONE 4 MG PO TABS: 4.0000 mg | ORAL_TABLET | Freq: Two times a day (BID) | ORAL | Status: DC

## 2019-07-22 MED ORDER — LEVETIRACETAM 500 MG PO TABS: 500.0000 mg | ORAL_TABLET | Freq: Two times a day (BID) | ORAL | 0 refills | Status: DC

## 2019-07-22 NOTE — Progress Notes (Signed)
SATURATION QUALIFICATIONS: (This note is used to comply with regulatory documentation for home oxygen)  Patient Saturations on Room Air at Rest =93%  Patient Saturations on Room Air while Ambulating = 86%  Patient Saturations on 3L nasal cannula =96%  Please briefly explain why patient needs home oxygen: during ambulation, patient oxygen saturation drops to mid 80s. Gabriel Atkins

## 2019-07-22 NOTE — TOC Transition Note (Addendum)
Transition of Care [facility]) - CM/SW Discharge Note   Patient Details  Name: Gabriel Atkins MRN: [id number] Date of Birth: February 20, 1951  Transition of [facility]) CM/SW Contact:  Pollie Friar, RN Phone Number: 07/22/2019, 4:05 PM   Clinical Narrative:    Pt discharging home with self care. No f/u per PT/OT. Pt qualified for home oxygen with his ambulatory sats. CM met with patient and [employer] decided on. Oxygen to be delivered to the room.  PCP: Dr Gunnar Bulla in [place], [place] Pt has transportation home.    Final next level of care: Home/Self Care Barriers to Discharge: No Barriers Identified   Patient Goals and CMS Choice     Choice offered to / list presented to : Patient  Discharge Placement                       Discharge Plan and Services                DME Arranged: Oxygen DME Agency: [employer] Date DME Agency Contacted: 07/22/19 Time DME Agency Contacted: 1600 Representative spoke with at DME Agency: [place] ([place]) Interventions     Readmission Risk Interventions No flowsheet data found.

## 2019-07-22 NOTE — Discharge Summary (Signed)
Physician Discharge Summary  Gabriel Atkins SHF:[id number] DOB: Apr 21, 1951 DOA: 07/19/2019  PCP: Patient, No Pcp Per  Admit date: 07/19/2019 Discharge date: 07/22/2019  Admitted From: Home Discharge disposition: Home   Code Status: Full Code   Recommendations for Outpatient Follow-Up:   1. Follow-up with neurosurgery, pulmonology and medical oncology as an outpatient 2. Follow-up with primary care provider.  Discharge Diagnosis:   Active Problems:   Seizure ([place])   Lung nodule < 6cm on CT   Brain tumor ([facility])   History of Present Illness / Brief narrative:  Patientis a 68 y.o.malewith PMHofHTN,arthritis, GERD and a recent anterior cervical decompression/discectomy-fusion at 4 levels from C3-C7 on 05/11/2019 by Dr. Vista Deck has been recovering well following that surgery, but has still been having tingling and spasms in his left arm. Hepresented to AP on 9/12withleft sided weakness and aphasia.  Patient got upon 9/12about 0800 for coffee and was normal.Per patient he remembers having a "spell" his left arm started jerking so he sat down on the bed, and then his whole body started shaking and jerking. It was shaking and jerking so hard that he fell off the bed. EMS was called. He then doesn't remember anythingafter fallinguntil paramedics arrived. No prior seizure history. Denies any HA, visual changes.  Head CT at [facility] was consistent with right hemispheric mass and was felt to be unlikely secondary to stroke. Patient was transferred to [facility] and started on keppra and decadron.  MRI brainwas obtained which showed findingsmost c/w infiltrating primary brain neoplasm. Patient was admitted under hospitalist medicine service. Neurosurgery consultation was obtained.  CT chest on 9/13 showed spiculated partially cavitating 2.3 cm nodule in the left upper lobe, highly concerning for primary bronchogenic carcinoma.  Subjective:  Seen and examined this morning.  Not in  distress.  Willing to go home.  Hospital Course:  Large right hemispheric brain mass -MRI finding as above.  Suspicious for primary brain tumor like glioblastoma or astrocytoma. -Neurosurgery and oncology evaluation obtained.  Per my discussion with neurosurgeon Dr. Saintclair Halsted this morning, patient will likely need a stereotactic biopsy, his office will schedule it tentatively for next week.    Lung mass suspicious for primary lung cancer -Has an approximate 80 pack year history of smoking  -CT scan of lung suspicious for primary lung cancer.   -I discussed the case with pulmonologist on-call Dr. Nelda Marseille this morning.  He reviewed the imaging.  Recommended bronchoscopic biopsy as an outpatient with either Dr. Valeta Harms or Dr. Lamonte Sakai.  I ordered for an ambulatory referral to pulmonology.  I have stated the plan clearly to patient and also discussed this with his wife.    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  Normal at baseline.  With IV hydration, improved normal today.    Hypertension -Currently with soft blood pressure readings -Resumed metoprolol.  Resume benazepril post discharge.  GERD -famotidine  Dyslipidemia -Continue atorvastatin -HDL 44, LDL 64.  New onset diabetes mellitus  -Presented with hyperglycemia.  A1c slightly elevated to 6.8. -Recommend dietary control for now.  Recenthistory of cervical decompression/discectomy -Appreciate neurosurgery evaluation and follow-up  Stable for discharge to home today.  Discharge Exam:  Vitals:   07/21/19 2343 07/22/19 0348 07/22/19 0700 07/22/19 1217  BP: 107/74  128/69 130/74 121/72  Pulse: (!) 56 (!) 54 60 64  Resp: 16 (!) 8 17   Temp: 98.2 F (36.8 C) 98.2 F (36.8 C) 97.8 F (36.6 C) 98.6 F (37 C)  TempSrc: Oral Oral Oral Oral  SpO2: 95% 97% 97% 98%  Weight:      Height:        Body mass index is 27.32 kg/m.  General exam: Appears calm and comfortable.  Skin: No rashes, lesions or ulcers. HEENT: Atraumatic, normocephalic, supple neck, no obvious bleeding Lungs: Clear to auscultation bilaterally CVS: Regular rate and rhythm, no murmur GI/Abd soft, nontender, nondistended, bowel sound present CNS: Alert, awake and oriented x3 Psychiatry: Mood appropriate, judgment and insight clear Extremities: No pedal edema, no calf tenderness  Discharge Instructions:  Wound care: None Discharge Instructions    Ambulatory referral to Pulmonology   Complete by: As directed    Prefer appointment within 1-2 weeks.   Increase activity slowly   Complete by: As directed       Allergies as of 07/22/2019      Reactions   Ciprofloxacin    Headaches, dizziness, cold sweats    Codeine Nausea And Vomiting   Headaches, dizziness, cold sweats    Morphine And Related    "doesn't work"   Latex Rash   Oxycodone Nausea Only   Pentazocine Nausea And Vomiting      Medication List    STOP taking these medications   aspirin 325 MG EC tablet     TAKE these medications   acetaminophen 500 MG tablet Commonly known as: TYLENOL Take 1,000 mg by mouth every 6 (six) hours as needed for moderate pain or headache.   amitriptyline 10 MG tablet Commonly known as: ELAVIL Take 10 mg by mouth at bedtime as needed for sleep.   atorvastatin 40 MG tablet Commonly known as: LIPITOR Take 40 mg by mouth at bedtime.   benazepril 20 MG tablet Commonly known as: LOTENSIN Take 20 mg by mouth daily.   BLUE-EMU PAIN RELIEF DRY EX Apply 1 application topically daily as needed (pain).   Centrum Silver Adult 50+ Tabs Take 1 tablet by mouth daily.     diphenhydrAMINE 25 MG tablet Commonly known as: BENADRYL Take 25 mg by mouth at bedtime.   famotidine 20 MG tablet Commonly known as: PEPCID Take 20 mg by mouth at bedtime.   levETIRAcetam 500 MG tablet Commonly known as: Keppra Take 1 tablet (500 mg total) by mouth 2 (two) times daily.   loratadine 10 MG tablet Commonly known as: CLARITIN Take 10 mg by mouth daily as needed for allergies.   methocarbamol 750 MG tablet Commonly known as: ROBAXIN Take 1 tablet by mouth 3 (three) times daily as needed.   metoprolol tartrate 25 MG tablet Commonly known as: LOPRESSOR Take 25 mg by mouth 2 (two) times daily.   miconazole 2 % cream Commonly known as: MICOTIN Apply 1 application topically daily as needed.   mometasone 50 MCG/ACT nasal spray Commonly known as: NASONEX Place 1-2 sprays into the nose daily as needed.   promethazine 12.5 MG tablet Commonly known as: PHENERGAN Take 1 tablet by mouth 3 (three) times daily as needed.   traMADol 50 MG tablet Commonly known as: ULTRAM Take 1 tablet by mouth 4 (four) times daily as needed.       Time coordinating discharge: 35 minutes  The results of  significant diagnostics from this hospitalization (including imaging, microbiology, ancillary and laboratory) are listed below for reference.    Procedures and Diagnostic Studies:   Ct Chest W Contrast  Result Date: 07/20/2019 CLINICAL DATA:  CNS neoplasm, evaluate for primary malignancy EXAM: CT CHEST, ABDOMEN, AND PELVIS WITH CONTRAST TECHNIQUE: Multidetector CT imaging of the chest, abdomen and pelvis was performed following the standard protocol during bolus administration of intravenous contrast. CONTRAST:  129mL OMNIPAQUE IOHEXOL 300 MG/ML  SOLN COMPARISON:  CT abdomen pelvis 09/12/2016, MR thoracic spine 01/07/2019 FINDINGS: CT CHEST FINDINGS Cardiovascular: Normal heart size. No pericardial effusion. Atherosclerotic calcification of the coronary arteries. Atherosclerotic  plaque within the normal caliber aorta. Calcifications present in the proximal great vessels as well. Central pulmonary arteries are normal caliber. Mediastinum/Nodes: Few prominent lymph nodes include a in 8 mm precarinal lymph node (1/29) and a 9 mm left hilar lymph node (1/37). No axillary adenopathy. Thyroid gland and thoracic inlet are unremarkable. Trachea is free of abnormality. Small amount of venous collateralization noted about the distal thoracic esophagus. Esophagus is otherwise unremarkable. Lungs/Pleura: There is a spiculated partially cavitating nodule in the left upper lobe measuring 2.3 x 1.9 x 1.6 cm in size. Extension of this lesion is seen to the adjacent subpleural surface and towards the major fissure. Bandlike areas of scarring and/or atelectasis are present in the left lower lobe and right middle lobe. There is mild apical predominant centrilobular emphysematous change. No other suspicious nodules or masses. Musculoskeletal: Prior anterior cervical spinal fusion. Remote compression deformity versus Schmorl's node formation noted at the T10 vertebral body. Nose acute fracture, vertebral body height loss or suspicious osseous lesion. No concerning chest wall soft tissue abnormality. CT ABDOMEN PELVIS FINDINGS Hepatobiliary: No visible or concerning hepatic lesions. Gallbladder is largely decompressed at the time of exam. No frank gallbladder wall thickening, pericholecystic fluid or inflammation or distal calcified gallstones either within the gallbladder lumen or biliary tree. Pancreas: Unremarkable. No pancreatic ductal dilatation or surrounding inflammatory changes. Spleen: Postsurgical changes from prior splenectomy with multiple surgical clips in left upper quadrant. Adrenals/Urinary Tract: Normal adrenal glands. No suspicious adrenal lesions. Mild bilateral symmetric perinephric stranding, a nonspecific finding though may correlate with either age or decreased renal function.  Subcentimeter hypoattenuating focus seen in the cortex of the right inferior pole too small to fully characterize on CT imaging but statistically likely benign. Urinary bladder is distended but otherwise unremarkable. Stomach/Bowel: Distal esophagus, stomach and duodenal sweep are unremarkable. No bowel wall thickening or dilatation. No evidence of obstruction. A normal appendix is visualized. Small surgical clip is noted near the appendiceal tip. Vascular/Lymphatic: Focal fusiform infrarenal abdominal aortic ectasia measuring up to 2.5 cm. Extensive atheromatous plaque throughout the aorta and branch vessels of the abdomen. Reproductive: The prostate and seminal vesicles are unremarkable. Other: No abdominopelvic free fluid or free gas. No bowel containing hernias. Musculoskeletal: Multilevel degenerative changes. Additional remote compression deformity versus large Schmorl's node formations are noted at the L1 and L3 levels. No suspicious osseous lesions. Suspect road remote posttraumatic deformity of the left pubic root. IMPRESSION: 1. Spiculated partially cavitating 2.3 cm nodule in the left upper lobe, highly concerning for primary bronchogenic carcinoma. Consider further evaluation with tissue sampling and/or PET-CT as clinically warranted. 2. Few prominent mediastinal and left hilar lymph nodes, nonspecific, but metastatic disease cannot be excluded. Could be further evaluated with PET-CT. 3. No evidence of metastatic disease within the abdomen or pelvis. 4. Focal fusiform infrarenal abdominal aortic ectasia measuring up to 2.5  cm. Ectatic abdominal aorta at risk for aneurysm development. Recommend followup by ultrasound in 5 years. This recommendation follows ACR consensus guidelines: White Paper of the ACR Incidental Findings Committee II on Vascular Findings. J Am Coll Radiol 2013; 10:789-794. Aortic aneurysm NOS (ICD10-I71.9) 5. Few prominent paraesophageal venous collaterals. Correlate with history of  variceal disease. 6. Post splenectomy. 7. Mild bilateral symmetric perinephric stranding, a nonspecific finding though may correlate with either age or decreased renal function. 8.  Emphysema (ICD10-J43.9). 9.  Aortic Atherosclerosis (ICD10-I70.0). Electronically Signed   By: Lovena Le M.D.   On: 07/20/2019 19:19   Ct Cervical Spine Wo Contrast  Result Date: 07/19/2019 CLINICAL DATA:  Patient found beside bed with left-sided weakness. EXAM: CT CERVICAL SPINE WITHOUT CONTRAST TECHNIQUE: Multidetector CT imaging of the cervical spine was performed without intravenous contrast. Multiplanar CT image reconstructions were also generated. COMPARISON:  MRI 01/07/2019 FINDINGS: Alignment: Normal. Skull base and vertebrae: Anterior fusion hardware intact from C3-C7. Vertebral body heights are normal. There is mild spondylosis throughout the cervical spine. There is moderate uncovertebral joint spurring. Atlantoaxial articulation is normal. There is significant multilevel bilateral neural foraminal narrowing most prominent from the C4-5 to the C6-7 levels. No acute fracture or traumatic subluxation. Soft tissues and spinal canal: No prevertebral fluid or swelling. No visible canal hematoma. Disc levels:  Interbody fusion from C3-C7. Upper chest: No acute findings. Other: None. IMPRESSION: No acute findings. Mild spondylosis throughout the cervical spine. Significant bilateral neural foraminal narrowing at multiple levels as described. Anterior fusion hardware intact from C3-C7. Electronically Signed   By: Marin Olp M.D.   On: 07/19/2019 10:29   Mr Jeri Cos SH Contrast  Result Date: 07/19/2019 CLINICAL DATA:  68 year old male with presentation of shaking and weakness. Masslike abnormality on noncontrast head CT earlier today. EXAM: MRI HEAD WITHOUT AND WITH CONTRAST TECHNIQUE: Multiplanar, multiecho pulse sequences of the brain and surrounding structures were obtained without and with intravenous contrast.  CONTRAST:  21mL GADAVIST GADOBUTROL 1 MMOL/ML IV SOLN COMPARISON:  Head CT at 0947 hours. FINDINGS: Brain: Within the right hemisphere there is a large masslike area of heterogeneously increased T2 and FLAIR hyperintensity with indistinct margins (series 9, image 17), T1 hypointensity. The right frontal, parietal, posterior temporal and anterior occipital lobes are affected. Following contrast only trace petechial type abnormal postcontrast enhancement seen in the right parietal region on series 18, image 43 and series 19, image 9. The abnormality encompasses 92 x 63 by 64 millimeters (AP by transverse by CC), and does appear to track into the splenium of the corpus callosum on series 9, image 16. The remaining corpus callosum appears spared. On DWI there is a heterogeneous region of partially restricted diffusion in the right parietal lobe which corresponds to the area of petechial enhancement (series 5 images 89-91). There may be trace hemosiderin in that region. Elsewhere diffusion is facilitated. Associated intracranial mass effect with 8 millimeters of leftward midline shift and partial effacement of the right lateral ventricle. Basilar cisterns remain patent. No signal abnormality or similar findings in the left hemisphere hemisphere, brainstem or cerebellum. No dural thickening. No other abnormal intracranial enhancement. No superimposed acute infarct. No restricted diffusion to suggest acute infarction. No ventriculomegaly, extra-axial collection or acute intracranial hemorrhage. Cervicomedullary junction and pituitary are within normal limits. Vascular: Major intracranial vascular flow voids are preserved. The major dural venous sinuses are enhancing and appear to be patent. Skull and upper cervical spine: Partially visible cervical ACDF. Visualized bone marrow signal is  within normal limits. Sinuses/Orbits: Negative orbits. Paranasal Visualized paranasal sinuses and mastoids are stable and well pneumatized.  Other: Visible internal auditory structures appear normal. Scalp and face soft tissues appear negative. IMPRESSION: 1. Large up to 9 cm infiltrative, predominantly T2 hyperintense mass in the right hemisphere with multilobar involvement. Only trace petechial enhancement in a right parietal region where hypercellularity is suspected on diffusion imaging. Some involvement of the splenium is suspected, but no extension across midline. 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 High-grade Astrocytoma and Glioblastoma are possible. Gliomatosis cerebri is possible. CNS lymphoma is less likely. 2. Stable intracranial mass effect from earlier today. Electronically Signed   By: Genevie Ann M.D.   On: 07/19/2019 19:21   Ct Abdomen Pelvis W Contrast  Result Date: 07/20/2019 CLINICAL DATA:  CNS neoplasm, evaluate for primary malignancy EXAM: CT CHEST, ABDOMEN, AND PELVIS WITH CONTRAST TECHNIQUE: Multidetector CT imaging of the chest, abdomen and pelvis was performed following the standard protocol during bolus administration of intravenous contrast. CONTRAST:  133mL OMNIPAQUE IOHEXOL 300 MG/ML  SOLN COMPARISON:  CT abdomen pelvis 09/12/2016, MR thoracic spine 01/07/2019 FINDINGS: CT CHEST FINDINGS Cardiovascular: Normal heart size. No pericardial effusion. Atherosclerotic calcification of the coronary arteries. Atherosclerotic plaque within the normal caliber aorta. Calcifications present in the proximal great vessels as well. Central pulmonary arteries are normal caliber. Mediastinum/Nodes: Few prominent lymph nodes include a in 8 mm precarinal lymph node (1/29) and a 9 mm left hilar lymph node (1/37). No axillary adenopathy. Thyroid gland and thoracic inlet are unremarkable. Trachea is free of abnormality. Small amount of venous collateralization noted about the distal thoracic esophagus. Esophagus is otherwise unremarkable. Lungs/Pleura: There is a spiculated partially cavitating  nodule in the left upper lobe measuring 2.3 x 1.9 x 1.6 cm in size. Extension of this lesion is seen to the adjacent subpleural surface and towards the major fissure. Bandlike areas of scarring and/or atelectasis are present in the left lower lobe and right middle lobe. There is mild apical predominant centrilobular emphysematous change. No other suspicious nodules or masses. Musculoskeletal: Prior anterior cervical spinal fusion. Remote compression deformity versus Schmorl's node formation noted at the T10 vertebral body. Nose acute fracture, vertebral body height loss or suspicious osseous lesion. No concerning chest wall soft tissue abnormality. CT ABDOMEN PELVIS FINDINGS Hepatobiliary: No visible or concerning hepatic lesions. Gallbladder is largely decompressed at the time of exam. No frank gallbladder wall thickening, pericholecystic fluid or inflammation or distal calcified gallstones either within the gallbladder lumen or biliary tree. Pancreas: Unremarkable. No pancreatic ductal dilatation or surrounding inflammatory changes. Spleen: Postsurgical changes from prior splenectomy with multiple surgical clips in left upper quadrant. Adrenals/Urinary Tract: Normal adrenal glands. No suspicious adrenal lesions. Mild bilateral symmetric perinephric stranding, a nonspecific finding though may correlate with either age or decreased renal function. Subcentimeter hypoattenuating focus seen in the cortex of the right inferior pole too small to fully characterize on CT imaging but statistically likely benign. Urinary bladder is distended but otherwise unremarkable. Stomach/Bowel: Distal esophagus, stomach and duodenal sweep are unremarkable. No bowel wall thickening or dilatation. No evidence of obstruction. A normal appendix is visualized. Small surgical clip is noted near the appendiceal tip. Vascular/Lymphatic: Focal fusiform infrarenal abdominal aortic ectasia measuring up to 2.5 cm. Extensive atheromatous plaque  throughout the aorta and branch vessels of the abdomen. Reproductive: The prostate and seminal vesicles are unremarkable. Other: No abdominopelvic free fluid or free gas. No bowel containing hernias. Musculoskeletal: Multilevel degenerative  changes. Additional remote compression deformity versus large Schmorl's node formations are noted at the L1 and L3 levels. No suspicious osseous lesions. Suspect road remote posttraumatic deformity of the left pubic root. IMPRESSION: 1. Spiculated partially cavitating 2.3 cm nodule in the left upper lobe, highly concerning for primary bronchogenic carcinoma. Consider further evaluation with tissue sampling and/or PET-CT as clinically warranted. 2. Few prominent mediastinal and left hilar lymph nodes, nonspecific, but metastatic disease cannot be excluded. Could be further evaluated with PET-CT. 3. No evidence of metastatic disease within the abdomen or pelvis. 4. 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 Ectatic abdominal aorta at risk for aneurysm development. Recommend followup by ultrasound in 5 years. This recommendation follows ACR consensus guidelines: White Paper of the ACR Incidental Findings Committee II on Vascular Findings. J Am Coll Radiol 2013; 10:789-794. Aortic aneurysm NOS (ICD10-I71.9) 5. Few prominent paraesophageal venous collaterals. Correlate with history of variceal disease. 6. Post splenectomy. 7. Mild bilateral symmetric perinephric stranding, a nonspecific finding though may correlate with either age or decreased renal function. 8.  Emphysema (ICD10-J43.9). 9.  Aortic Atherosclerosis (ICD10-I70.0). Electronically Signed   By: Lovena Le M.D.   On: 07/20/2019 19:19   Ct Head Code Stroke Wo Contrast  Result Date: 07/19/2019 CLINICAL DATA:  Code stroke. Episode of arm shaking and weakness today. No symptoms prior to today. EXAM: CT HEAD WITHOUT CONTRAST TECHNIQUE: Contiguous axial images were obtained from the base of  the skull through the vertex without intravenous contrast. COMPARISON:  None. FINDINGS: Brain: Masslike abnormality with high-density/preserved cortex in the right frontal and parietal region with midline shift measuring 8 mm. Although subacute infarct with petechial hemorrhage is considered there were no symptoms yesterday such that mass with vasogenic edema and seizure is most likely. No hematoma or hydrocephalus. Vascular: No hyperdense vessel. Skull: Negative Sinuses/Orbits: Negative Other: These results were called by telephone at the time of interpretation on 07/19/2019 at 10:13 am to provider [facility] , who verbally acknowledged these results. ASPECTS [facility] Stroke Program Early CT Score) Not scored in this setting IMPRESSION: Masslike abnormality centered along the high right frontal parietal lobe with vasogenic edema and 8 mm midline shift. Recommend brain MRI with contrast. Electronically Signed   By: Monte Fantasia M.D.   On: 07/19/2019 10:14     Labs:   Basic Metabolic Panel: Recent Labs  Lab 07/19/19 0950 07/20/19 0349  NA 137 139  K 3.8 4.3  CL 105 107  CO2 15* 22  GLUCOSE 191* 168*  BUN 10 11  CREATININE 1.41* 1.12  CALCIUM 9.1 9.3   GFR Estimated Creatinine Clearance: 64 mL/min (by C-G formula based on SCr of 1.12 mg/dL). Liver Function Tests: Recent Labs  Lab 07/19/19 0950  AST 36  ALT 29  ALKPHOS 82  BILITOT 0.6  PROT 7.5  ALBUMIN 4.0   No results for input(s): LIPASE, AMYLASE in the last 168 hours. No results for input(s): AMMONIA in the last 168 hours. Coagulation profile Recent Labs  Lab 07/19/19 0950  INR 1.3*    CBC: Recent Labs  Lab 07/19/19 0950 07/20/19 0349  WBC 10.5 13.6*  NEUTROABS 4.5  --   HGB 14.3 13.0  HCT 46.2 40.0  MCV 101.5* 97.8  PLT 225 211   Cardiac Enzymes: No results for input(s): CKTOTAL, CKMB, CKMBINDEX, TROPONINI in the last 168 hours. BNP: Invalid input(s): POCBNP CBG: Recent Labs  Lab 07/19/19 1010   GLUCAP 170*   D-Dimer No results for  input(s): DDIMER in the last 72 hours. Hgb A1c No results for input(s): HGBA1C in the last 72 hours. Lipid Profile No results for input(s): CHOL, HDL, LDLCALC, TRIG, CHOLHDL, LDLDIRECT in the last 72 hours. Thyroid function studies No results for input(s): TSH, T4TOTAL, T3FREE, THYROIDAB in the last 72 hours.  Invalid input(s): FREET3 Anemia work up No results for input(s): VITAMINB12, FOLATE, FERRITIN, TIBC, IRON, RETICCTPCT in the last 72 hours. Microbiology Recent Results (from the past 240 hour(s))  Urine culture     Status: Abnormal   Collection Time: 07/19/19  9:45 AM   Specimen: Urine, Catheterized  Result Value Ref Range Status   Specimen Description   Final    URINE, CATHETERIZED Performed at [facility] [address], [address] [place], [place] [address]    Special Requests   Final    NONE Performed at [facility], [address] [place], [place] [address]    Culture (A)  Final    <10,000 COLONIES/mL INSIGNIFICANT GROWTH Performed at [facility] [address], [place], [place] [address]    Report Status 07/21/2019 FINAL  Final  SARS Coronavirus 2 [facility] order, Performed in [facility] hospital lab) Nasopharyngeal Nasopharyngeal Swab     Status: None   Collection Time: 07/19/19 10:59 AM   Specimen: Nasopharyngeal Swab  Result Value Ref Range Status   SARS Coronavirus 2 NEGATIVE NEGATIVE Final    Comment: (NOTE) If result is NEGATIVE SARS-CoV-2 target nucleic acids are NOT DETECTED. The SARS-CoV-2 RNA is generally detectable in upper and lower  respiratory specimens during the acute phase of infection. The lowest  concentration of SARS-CoV-2 viral copies this assay can detect is 250  copies / mL. A negative result does not preclude SARS-CoV-2 infection  and should not be used as the sole basis for treatment or other  patient management decisions.  A negative result may occur with  improper specimen collection /  handling, submission of specimen other  than nasopharyngeal swab, presence of viral mutation(s) within the  areas targeted by this assay, and inadequate number of viral copies  (<250 copies / mL). A negative result must be combined with clinical  observations, patient history, and epidemiological information. If result is POSITIVE SARS-CoV-2 target nucleic acids are DETECTED. The SARS-CoV-2 RNA is generally detectable in upper and lower  respiratory specimens dur ing the acute phase of infection.  Positive  results are indicative of active infection with SARS-CoV-2.  Clinical  correlation with patient history and other diagnostic information is  necessary to determine patient infection status.  Positive results do  not rule out bacterial infection or co-infection with other viruses. If result is PRESUMPTIVE POSTIVE SARS-CoV-2 nucleic acids MAY BE PRESENT.   A presumptive positive result was obtained on the submitted specimen  and confirmed on repeat testing.  While 2019 novel coronavirus  (SARS-CoV-2) nucleic acids may be present in the submitted sample  additional confirmatory testing may be necessary for epidemiological  and / or clinical management purposes  to differentiate between  SARS-CoV-2 and other Sarbecovirus currently known to infect humans.  If clinically indicated additional testing with an alternate test  methodology [phone]) is advised. The SARS-CoV-2 RNA is generally  detectable in upper and lower respiratory sp ecimens during the acute  phase of infection. The expected result is Negative. Fact Sheet for Patients:  StrictlyIdeas.no Fact Sheet for Healthcare Providers: BankingDealers.co.za This test is not yet approved or cleared by the [place] FDA and has been authorized for detection  and/or diagnosis of SARS-CoV-2 by FDA under an Emergency Use Authorization (EUA).  This EUA will remain in effect (meaning this  test can be used) for the duration of the COVID-19 declaration under Section 564(b)(1) of the Act, 21 U.S.C. section 360bbb-3(b)(1), unless the authorization is terminated or revoked sooner. Performed at [facility], [address], [place], [place] [address]     Signed: Terrilee Croak  [facility] 07/22/2019, 2:58 PM

## 2019-07-22 NOTE — Care Management Important Message (Signed)
Important Message  Patient Details  Name: Gabriel Atkins MRN: [id number] Date of Birth: [birth year]   Medicare Important Message Given:  Yes  Patient was not able to sign./ Unsigned copy left     Aanshi Batchelder 07/22/2019, 3:14 PM

## 2019-07-22 NOTE — Progress Notes (Signed)
Physical Therapy Treatment Patient Details Name: Gabriel Atkins MRN: [id number] DOB: 1950-11-19 Today's Date: 07/22/2019    History of Present Illness Gabriel Atkins is a 68 y.o. male with medical history significant for arthritis, GERD, hypertension, and recent anterior cervical decompression/discectomy-fusion at 4 levels from C3-C7 on 05/21/2019, who was at his usual baseline when he awoke this morning to get coffee at 8 AM.  His wife then heard a fall with some left-sided weakness and aphasia. Wife noted generalized shaking. CT of head was positive for abnormal mass frontoparietal region with midline shift.     PT Comments    Pt ambulating well but O2 sats drop to 87% on RA. Returned to mid 90's on 2L O2. Discussed use of home O2 as well as need to purchase pulse ox for home monitoring. Pt ambulated 400' with supervision, no AD needed. PT will continue to follow.    Follow Up Recommendations  No PT follow up     Equipment Recommendations  None recommended by PT    Recommendations for Other Services       Precautions / Restrictions Precautions Precautions: Fall;Cervical Precaution Booklet Issued: No Precaution Comments: cervical fusion 7/20 Required Braces or Orthoses: Cervical Brace Cervical Brace: Hard collar Restrictions Weight Bearing Restrictions: No    Mobility  Bed Mobility Overal bed mobility: Independent             General bed mobility comments: needed increased time and HOB was elevated but no physical assist needed  Transfers Overall transfer level: Modified independent Equipment used: None Transfers: Sit to/from Stand Sit to Stand: Modified independent (Device/Increase time)         General transfer comment: pt stood safely from bed without assist  Ambulation/Gait Ambulation/Gait assistance: Min Gaffer (Feet): 400 Feet Assistive device: None Gait Pattern/deviations: Decreased stride length Gait velocity: decreased Gait  velocity interpretation: 1.31 - 2.62 ft/sec, indicative of limited community ambulator General Gait Details: pt with guarded gait at first and min-guard given but after 50' pt ambulated with supervision. SPO2 on RA down to 87%, 2/4 OE   Stairs             Wheelchair Mobility    Modified Rankin (Stroke Patients Only)       Balance Overall balance assessment: No apparent balance deficits (not formally assessed) Sitting-balance support: Feet supported;No upper extremity supported Sitting balance-Leahy Scale: Good     Standing balance support: No upper extremity supported Standing balance-Leahy Scale: Good Standing balance comment: pt moves slowly, increased reaction time noted                            Cognition Arousal/Alertness: Awake/alert Behavior During Therapy: WFL for tasks assessed/performed Overall Cognitive Status: Within Functional Limits for tasks assessed                                        Exercises      General Comments General comments (skin integrity, edema, etc.): discussed moving every hour upon return home      Pertinent Vitals/Pain Pain Assessment: No/denies pain    Home Living                      Prior Function            PT Goals (current goals can now be  found in the care plan section) Acute Rehab PT Goals Patient Stated Goal: go home PT Goal Formulation: With patient Time For Goal Achievement: 08/02/19 Potential to Achieve Goals: Good Progress towards PT goals: Progressing toward goals    Frequency    Min 3X/week      PT Plan Current plan remains appropriate    Co-evaluation              AM-PAC PT "6 Clicks" Mobility   Outcome Measure  Help needed turning from your back to your side while in a flat bed without using bedrails?: None Help needed moving from lying on your back to sitting on the side of a flat bed without using bedrails?: None Help needed moving to and from a  bed to a chair (including a wheelchair)?: None Help needed standing up from a chair using your arms (e.g., wheelchair or bedside chair)?: None Help needed to walk in hospital room?: A Little Help needed climbing 3-5 steps with a railing? : A Little 6 Click Score: 22    End of Session Equipment Utilized During Treatment: Gait belt Activity Tolerance: Patient tolerated treatment well Patient left: in bed;with bed alarm set;with call bell/phone within reach Nurse Communication: Mobility status PT Visit Diagnosis: Muscle weakness (generalized) (M62.81) Pain - Right/Left: Left Pain - part of body: Hand;Ankle and joints of foot     Time: 9563-8756 PT Time Calculation (min) (ACUTE ONLY): 13 min  Charges:  $Gait Training: 8-22 mins                     Leighton Roach, Kimberly  Pager [phone] Office [place] 07/22/2019, 4:55 PM

## 2019-07-23 ENCOUNTER — Other Ambulatory Visit: Payer: Self-pay | Admitting: Neurosurgery

## 2019-07-23 DIAGNOSIS — D496 Neoplasm of unspecified behavior of brain: Secondary | ICD-10-CM

## 2019-07-23 DIAGNOSIS — C3412 Malignant neoplasm of upper lobe, left bronchus or lung: Secondary | ICD-10-CM

## 2019-07-24 ENCOUNTER — Other Ambulatory Visit: Payer: Self-pay | Admitting: Radiation Therapy

## 2019-07-25 ENCOUNTER — Telehealth: Payer: Self-pay | Admitting: Hematology

## 2019-07-25 ENCOUNTER — Other Ambulatory Visit: Payer: Self-pay | Admitting: Neurosurgery

## 2019-07-25 ENCOUNTER — Other Ambulatory Visit: Payer: Self-pay | Admitting: Radiation Therapy

## 2019-07-25 NOTE — Telephone Encounter (Signed)
A hospital follow up appt has been scheduled for Gabriel Atkins to see Dr. Irene Limbo on 10/2 at 12pm. I provided the appt date and time to the pt's wife who's aware to arrive 15 minutes early.

## 2019-07-28 ENCOUNTER — Encounter (HOSPITAL_COMMUNITY): Payer: Self-pay

## 2019-07-28 ENCOUNTER — Other Ambulatory Visit (HOSPITAL_COMMUNITY)
Admission: RE | Admit: 2019-07-28 | Discharge: 2019-07-28 | Disposition: A | Discharge status: Home / Self Care | Payer: Medicare Other | Source: Ambulatory Visit | Attending: Neurosurgery | Admitting: Neurosurgery

## 2019-07-28 ENCOUNTER — Other Ambulatory Visit: Payer: Self-pay

## 2019-07-28 ENCOUNTER — Ambulatory Visit (HOSPITAL_COMMUNITY)
Admission: RE | Admit: 2019-07-28 | Discharge: 2019-07-28 | Disposition: A | Discharge status: Home / Self Care | Payer: Medicare Other | Source: Ambulatory Visit | Attending: Neurosurgery | Admitting: Neurosurgery

## 2019-07-28 ENCOUNTER — Encounter (HOSPITAL_COMMUNITY)
Admission: RE | Admit: 2019-07-28 | Discharge: 2019-07-28 | Disposition: A | Discharge status: Home / Self Care | Payer: Medicare Other | Source: Ambulatory Visit | Attending: Neurosurgery | Admitting: Neurosurgery

## 2019-07-28 DIAGNOSIS — D496 Neoplasm of unspecified behavior of brain: Secondary | ICD-10-CM

## 2019-07-28 LAB — SARS CORONAVIRUS 2 (TAT 6-24 HRS): SARS Coronavirus 2: NEGATIVE

## 2019-07-28 MED ORDER — GADOBUTROL 1 MMOL/ML IV SOLN: 8.0000 mL | Freq: Once | INTRAVENOUS | Status: DC | PRN

## 2019-07-28 NOTE — Progress Notes (Signed)
Gabriel Atkins, Gabriel Atkins [address] Phone: [phone] Fax: [phone]      Your procedure is scheduled on Wednesday 07/30/2019.  Report to [facility] Main Entrance "A" at 09:00 A.M., and check in at the Admitting office.  Call this number if you have problems the morning of surgery:  [phone]  Call [phone] if you have any questions prior to your surgery date Monday-Friday 8am-4pm    Remember:  Do not eat or drink after midnight the night before your surgery    Take these medicines the morning of surgery with A SIP OF WATER: Dexamethasone (Decadron) Levetiracetam (Keppra) Metoprolol (Lopressor)  If Needed: Acetaminophen (Tylenol) Loratidine (Claritin) Methocarbamol (Robaxin) Mometasone (Nasonex) Promethazine (Phenergan) Tramadol (ultram)   7 days prior to surgery STOP taking any Aspirin (unless otherwise instructed by your surgeon), Aleve, Naproxen, Ibuprofen, Motrin, Advil, Goody's, BC's, all herbal medications, fish oil, and all vitamins.    The Morning of Surgery  Do not wear jewelry.  Do not wear lotions, powders, or colognes, or deodorant  Men may shave face and neck.  Do not bring valuables to the hospital.  [facility] is not responsible for any belongings or valuables.  IF you are a smoker, DO NOT Smoke 24 hours prior to surgery  IF you wear a CPAP at night please bring your mask, tubing, and machine the morning of surgery   Remember that you must have someone to transport you home after your surgery, and remain with you for 24 hours if you are discharged the same day.   Contacts, eyeglasses, hearing aids, dentures or bridgework may not be worn into surgery.    Leave your suitcase in the car.  After surgery it may be brought to your room.  For patients admitted to the hospital, discharge time will be determined by your treatment team.  Patients discharged the day of surgery will not  be allowed to drive home.    Special instructions:   [place]- Preparing For Surgery  Before surgery, you can play an important role. Because skin is not sterile, your skin needs to be as free of germs as possible. You can reduce the number of germs on your skin by washing with CHG (chlorahexidine gluconate) Soap before surgery.  CHG is an antiseptic cleaner which kills germs and bonds with the skin to continue killing germs even after washing.    Oral Hygiene is also important to reduce your risk of infection.  Remember - BRUSH YOUR TEETH THE MORNING OF SURGERY WITH YOUR REGULAR TOOTHPASTE  Please do not use if you have an allergy to CHG or antibacterial soaps. If your skin becomes reddened/irritated stop using the CHG.  Do not shave (including legs and underarms) for at least 48 hours prior to first CHG shower. It is OK to shave your face.  Please follow these instructions carefully.   1. Shower the NIGHT BEFORE SURGERY and the MORNING OF SURGERY with CHG Soap.   2. If you chose to wash your hair, wash your hair first as usual with your normal shampoo.  3. After you shampoo, rinse your hair and body thoroughly to remove the shampoo.  4. Use CHG as you would any other liquid soap. You can apply CHG directly to the skin and wash gently with a scrungie or a clean washcloth.   5. Apply the CHG Soap to your body ONLY FROM THE NECK DOWN.  Do not use on open wounds or open sores. Avoid contact with your eyes, ears, mouth and genitals (private parts). Wash Face and genitals (private parts)  with your normal soap.   6. Wash thoroughly, paying special attention to the area where your surgery will be performed.  7. Thoroughly rinse your body with warm water from the neck down.  8. DO NOT shower/wash with your normal soap after using and rinsing off the CHG Soap.  9. Pat yourself dry with a CLEAN TOWEL.  10. Wear CLEAN PAJAMAS to bed the night before surgery, wear comfortable clothes the  morning of surgery  11. Place CLEAN SHEETS on your bed the night of your first shower and DO NOT SLEEP WITH PETS.    Day of Surgery:  Please shower the morning of surgery with the CHG soap  Do not apply any deodorants/lotions. Please wear clean clothes to the hospital/surgery center.   Remember to brush your teeth WITH YOUR REGULAR TOOTHPASTE.   Please read over the following fact sheets that you were given.

## 2019-07-28 NOTE — Progress Notes (Signed)
PCP - Dr. Delaney Meigs Cardiologist -   Chest x-ray - n/a EKG - 07/21/2019 Stress Test -  ECHO -  Cardiac Cath -   Sleep Study -  CPAP -   Fasting Blood Sugar - n/a Checks Blood Sugar _____ times a day  Blood Thinner Instructions: n/a Aspirin Instructions:  Anesthesia review: yes, recent admission  Patient denies shortness of breath, fever, cough and chest pain at PAT appointment   Patient verbalized understanding of instructions that were given to them at the PAT appointment. Patient was also instructed that they will need to review over the PAT instructions again at home before surgery.

## 2019-07-28 NOTE — Progress Notes (Signed)
PCP - Dr. Gunnar Bulla Cardiologist - N/A  PPM/ICD - N/A Device Orders - N/A Rep Notified N/A   Chest x-ray - N/A EKG - 07/21/2019 Stress Test - N/A ECHO - N/A Cardiac Cath - N/A  Sleep Study - N/A CPAP - N/A  Fasting Blood Sugar - N/A Checks Blood Sugar _____ times a day  Blood Thinner Instructions: N/A Aspirin Instructions: N/A  ERAS Protcol - N/A PRE-SURGERY Ensure - N/A  COVID TEST- 07/28/19   Anesthesia review: Yes, recent admission  Patient denies shortness of breath, fever, cough and chest pain at PAT appointment   Coronavirus Screening  Have you experienced the following symptoms:  Cough yes/no: No Fever (>100.68F)  yes/no: No Runny nose yes/no: No Sore throat yes/no: No Difficulty breathing/shortness of breath  yes/no: No  Have you or a family member traveled in the last 14 days and where? yes/no: No   If the patient indicates "YES" to the above questions, their PAT will be rescheduled to limit the exposure to others and, the surgeon will be notified. THE PATIENT WILL NEED TO BE ASYMPTOMATIC FOR 14 DAYS.   If the patient is not experiencing any of these symptoms, the PAT nurse will instruct them to NOT bring anyone with them to their appointment since they may have these symptoms or traveled as well.   Please remind your patients and families that hospital visitation restrictions are in effect and the importance of the restrictions.    Patient verbalized understanding of instructions that were given to them at the PAT appointment. Patient was also instructed that they will need to review over the PAT instructions again at home before surgery.

## 2019-07-29 ENCOUNTER — Other Ambulatory Visit (HOSPITAL_COMMUNITY): Payer: Self-pay | Admitting: Neurosurgery

## 2019-07-29 DIAGNOSIS — D496 Neoplasm of unspecified behavior of brain: Secondary | ICD-10-CM

## 2019-07-29 NOTE — Anesthesia Preprocedure Evaluation (Deleted)
Anesthesia Evaluation    Airway        Dental   Pulmonary former smoker,           Cardiovascular hypertension,      Neuro/Psych    GI/Hepatic   Endo/Other    Renal/GU      Musculoskeletal   Abdominal   Peds  Hematology   Anesthesia Other Findings   Reproductive/Obstetrics                                                              Anesthesia Evaluation  Patient identified by MRN, date of birth, ID band Patient awake    Reviewed: Allergy & Precautions, NPO status , Patient's Chart, lab work & pertinent test results, reviewed documented beta blocker date and time   History of Anesthesia Complications Negative for: history of anesthetic complications  Airway Mallampati: II  TM Distance: >3 FB Neck ROM: Full    Dental  (+) Edentulous Upper, Poor Dentition, Missing, Dental Advisory Given, Chipped   Pulmonary COPD, former smoker,  05/19/2019 SARS coronavirus NEG   breath sounds clear to auscultation       Cardiovascular hypertension, Pt. on medications and Pt. on home beta blockers (-) angina Rhythm:Regular Rate:Normal     Neuro/Psych cervicalgia    GI/Hepatic Neg liver ROS, GERD  Medicated and Controlled,  Endo/Other  negative endocrine ROS  Renal/GU negative Renal ROS     Musculoskeletal   Abdominal   Peds  Hematology negative hematology ROS (+)   Anesthesia Other Findings   Reproductive/Obstetrics                             Anesthesia Physical Anesthesia Plan  ASA: II  Anesthesia Plan: General   Post-op Pain Management:    Induction: Intravenous  PONV Risk Score and Plan: 3 and Scopolamine patch - Pre-op, Dexamethasone and Ondansetron  Airway Management Planned: Oral ETT  Additional Equipment:   Intra-op Plan:   Post-operative Plan: Extubation in OR  Informed Consent: I have reviewed the patients History and  Physical, chart, labs and discussed the procedure including the risks, benefits and alternatives for the proposed anesthesia with the patient or authorized representative who has indicated his/her understanding and acceptance.     Dental advisory given  Plan Discussed with: CRNA and Surgeon  Anesthesia Plan Comments:         Anesthesia Quick Evaluation  Anesthesia Physical Anesthesia Plan  ASA:   Anesthesia Plan:    Post-op Pain Management:    Induction:   PONV Risk Score and Plan:   Airway Management Planned:   Additional Equipment:   Intra-op Plan:   Post-operative Plan:   Informed Consent:   Plan Discussed with:   Anesthesia Plan Comments:       Anesthesia Quick Evaluation

## 2019-07-30 ENCOUNTER — Encounter (HOSPITAL_COMMUNITY): Payer: Self-pay | Admitting: *Deleted

## 2019-07-30 ENCOUNTER — Ambulatory Visit (HOSPITAL_COMMUNITY)
Admission: RE | Admit: 2019-07-30 | Discharge: 2019-07-30 | Disposition: A | Discharge status: Home / Self Care | Payer: Medicare Other | Source: Ambulatory Visit | Attending: Neurosurgery | Admitting: Neurosurgery

## 2019-07-30 ENCOUNTER — Inpatient Hospital Stay (HOSPITAL_COMMUNITY)
Admission: RE | Admit: 2019-07-30 | Discharge: 2019-07-31 | DRG: 027 | Disposition: A | Discharge status: Home / Self Care | Payer: Medicare Other | Attending: Neurosurgery | Admitting: Neurosurgery

## 2019-07-30 ENCOUNTER — Other Ambulatory Visit: Payer: Self-pay

## 2019-07-30 ENCOUNTER — Inpatient Hospital Stay (HOSPITAL_COMMUNITY): Payer: Medicare Other | Admitting: Certified Registered Nurse Anesthetist

## 2019-07-30 ENCOUNTER — Encounter (HOSPITAL_COMMUNITY)
Admission: RE | Disposition: A | Discharge status: Home / Self Care | Payer: Self-pay | Source: Home / Self Care | Attending: Neurosurgery

## 2019-07-30 ENCOUNTER — Inpatient Hospital Stay (HOSPITAL_COMMUNITY): Payer: Medicare Other | Admitting: Physician Assistant

## 2019-07-30 DIAGNOSIS — Z87891 Personal history of nicotine dependence: Secondary | ICD-10-CM | POA: Diagnosis not present

## 2019-07-30 DIAGNOSIS — K219 Gastro-esophageal reflux disease without esophagitis: Secondary | ICD-10-CM | POA: Diagnosis present

## 2019-07-30 DIAGNOSIS — I10 Essential (primary) hypertension: Secondary | ICD-10-CM | POA: Diagnosis present

## 2019-07-30 DIAGNOSIS — Z91040 Latex allergy status: Secondary | ICD-10-CM | POA: Diagnosis not present

## 2019-07-30 DIAGNOSIS — Z888 Allergy status to other drugs, medicaments and biological substances status: Secondary | ICD-10-CM

## 2019-07-30 DIAGNOSIS — C719 Malignant neoplasm of brain, unspecified: Secondary | ICD-10-CM | POA: Diagnosis present

## 2019-07-30 DIAGNOSIS — Z881 Allergy status to other antibiotic agents status: Secondary | ICD-10-CM | POA: Diagnosis not present

## 2019-07-30 DIAGNOSIS — J449 Chronic obstructive pulmonary disease, unspecified: Secondary | ICD-10-CM | POA: Diagnosis present

## 2019-07-30 DIAGNOSIS — Z79899 Other long term (current) drug therapy: Secondary | ICD-10-CM | POA: Diagnosis not present

## 2019-07-30 DIAGNOSIS — C713 Malignant neoplasm of parietal lobe: Secondary | ICD-10-CM | POA: Diagnosis present

## 2019-07-30 DIAGNOSIS — D496 Neoplasm of unspecified behavior of brain: Secondary | ICD-10-CM

## 2019-07-30 DIAGNOSIS — Z885 Allergy status to narcotic agent status: Secondary | ICD-10-CM | POA: Diagnosis not present

## 2019-07-30 DIAGNOSIS — Z20828 Contact with and (suspected) exposure to other viral communicable diseases: Secondary | ICD-10-CM | POA: Diagnosis present

## 2019-07-30 DIAGNOSIS — M1990 Unspecified osteoarthritis, unspecified site: Secondary | ICD-10-CM | POA: Diagnosis present

## 2019-07-30 DIAGNOSIS — R569 Unspecified convulsions: Secondary | ICD-10-CM | POA: Diagnosis present

## 2019-07-30 HISTORY — PX: APPLICATION OF CRANIAL NAVIGATION: SHX6578

## 2019-07-30 HISTORY — PX: BRAIN BIOPSY: SHX6409

## 2019-07-30 LAB — BASIC METABOLIC PANEL
Anion gap: 7 (ref 5–15)
BUN: 19 mg/dL (ref 8–23)
CO2: 21 mmol/L — ABNORMAL LOW (ref 22–32)
Calcium: 8.2 mg/dL — ABNORMAL LOW (ref 8.9–10.3)
Chloride: 108 mmol/L (ref 98–111)
Creatinine, Ser: 1.08 mg/dL (ref 0.61–1.24)
GFR calc Af Amer: >60 mL/min (ref >60)
GFR calc non Af Amer: >60 mL/min (ref >60)
Glucose, Bld: 154 mg/dL — ABNORMAL HIGH (ref 70–99)
Potassium: 4.4 mmol/L (ref 3.5–5.1)
Sodium: 136 mmol/L (ref 135–145)

## 2019-07-30 LAB — CBC
HCT: 40.4 % (ref 39.0–52.0)
Hemoglobin: 13.7 g/dL (ref 13.0–17.0)
MCH: 32.5 pg (ref 26.0–34.0)
MCHC: 33.9 g/dL (ref 30.0–36.0)
MCV: 95.7 fL (ref 80.0–100.0)
Platelets: 170 10*3/uL (ref 150–400)
RBC: 4.22 MIL/uL (ref 4.22–5.81)
RDW: 13.6 % (ref 11.5–15.5)
WBC: 14.3 10*3/uL — ABNORMAL HIGH (ref 4.0–10.5)
nRBC: 0 % (ref 0.0–0.2)

## 2019-07-30 SURGERY — BRAIN BIOPSY
Anesthesia: General | Laterality: Right

## 2019-07-30 MED ORDER — LORATADINE 10 MG PO TABS: 10.0000 mg | ORAL_TABLET | Freq: Every day | ORAL | Status: DC | PRN

## 2019-07-30 MED ORDER — BACITRACIN ZINC 500 UNIT/GM EX OINT
TOPICAL_OINTMENT | CUTANEOUS | Status: AC
  Filled 2019-07-30: qty 28.35

## 2019-07-30 MED ORDER — MIDAZOLAM HCL 5 MG/5ML IJ SOLN
INTRAMUSCULAR | Status: DC | PRN
  Administered 2019-07-30 (×2): 1 mg via INTRAVENOUS

## 2019-07-30 MED ORDER — PROPOFOL 10 MG/ML IV BOLUS
INTRAVENOUS | Status: DC | PRN
  Administered 2019-07-30: 60 mg via INTRAVENOUS
  Administered 2019-07-30: 50 mg via INTRAVENOUS
  Administered 2019-07-30: 100 mg via INTRAVENOUS
  Administered 2019-07-30 (×2): 20 mg via INTRAVENOUS

## 2019-07-30 MED ORDER — LACTATED RINGERS IV SOLN
INTRAVENOUS | Status: DC
  Administered 2019-07-30: 11:00:00 via INTRAVENOUS

## 2019-07-30 MED ORDER — POTASSIUM CHLORIDE IN NACL 20-0.9 MEQ/L-% IV SOLN
INTRAVENOUS | Status: DC
  Administered 2019-07-31: 01:00:00 via INTRAVENOUS
  Filled 2019-07-30: qty 1000

## 2019-07-30 MED ORDER — ONDANSETRON HCL 4 MG/2ML IJ SOLN: 4.0000 mg | INTRAMUSCULAR | Status: DC | PRN

## 2019-07-30 MED ORDER — DEXAMETHASONE SODIUM PHOSPHATE 10 MG/ML IJ SOLN
INTRAMUSCULAR | Status: AC
  Filled 2019-07-30: qty 1

## 2019-07-30 MED ORDER — DEXAMETHASONE SODIUM PHOSPHATE 4 MG/ML IJ SOLN: 4.0000 mg | Freq: Four times a day (QID) | INTRAMUSCULAR | Status: DC

## 2019-07-30 MED ORDER — LIDOCAINE 4 % EX PTCH: MEDICATED_PATCH | Freq: Every day | CUTANEOUS | Status: DC | PRN

## 2019-07-30 MED ORDER — LIDOCAINE-EPINEPHRINE 1 %-1:100000 IJ SOLN
INTRAMUSCULAR | Status: AC
  Filled 2019-07-30: qty 1

## 2019-07-30 MED ORDER — FENTANYL CITRATE (PF) 100 MCG/2ML IJ SOLN
INTRAMUSCULAR | Status: AC
  Administered 2019-07-30: 14:00:00 25 ug via INTRAVENOUS
  Filled 2019-07-30: qty 2

## 2019-07-30 MED ORDER — LIDOCAINE HCL (CARDIAC) PF 100 MG/5ML IV SOSY: PREFILLED_SYRINGE | INTRAVENOUS | Status: DC | PRN

## 2019-07-30 MED ORDER — FENTANYL CITRATE (PF) 250 MCG/5ML IJ SOLN
INTRAMUSCULAR | Status: AC
  Filled 2019-07-30: qty 5

## 2019-07-30 MED ORDER — CEFAZOLIN SODIUM-DEXTROSE 2-4 GM/100ML-% IV SOLN
INTRAVENOUS | Status: AC
  Filled 2019-07-30: qty 100

## 2019-07-30 MED ORDER — CHLORHEXIDINE GLUCONATE CLOTH 2 % EX PADS: 6.0000 | MEDICATED_PAD | Freq: Once | CUTANEOUS | Status: DC

## 2019-07-30 MED ORDER — DEXAMETHASONE SODIUM PHOSPHATE 10 MG/ML IJ SOLN
INTRAMUSCULAR | Status: DC | PRN
  Administered 2019-07-30: 10 mg via INTRAVENOUS

## 2019-07-30 MED ORDER — METHOCARBAMOL 500 MG PO TABS: 750.0000 mg | ORAL_TABLET | Freq: Four times a day (QID) | ORAL | Status: DC | PRN

## 2019-07-30 MED ORDER — 0.9 % SODIUM CHLORIDE (POUR BTL) OPTIME
TOPICAL | Status: DC | PRN
  Administered 2019-07-30 (×2): 1000 mL

## 2019-07-30 MED ORDER — PROPOFOL 10 MG/ML IV BOLUS
INTRAVENOUS | Status: AC
  Filled 2019-07-30: qty 20

## 2019-07-30 MED ORDER — ARTIFICIAL TEARS OPHTHALMIC OINT
TOPICAL_OINTMENT | OPHTHALMIC | Status: DC | PRN
  Administered 2019-07-30: 1 via OPHTHALMIC

## 2019-07-30 MED ORDER — PROPOFOL 10 MG/ML IV BOLUS: INTRAVENOUS | Status: DC | PRN

## 2019-07-30 MED ORDER — PROMETHAZINE HCL 25 MG PO TABS: 12.5000 mg | ORAL_TABLET | Freq: Four times a day (QID) | ORAL | Status: DC | PRN

## 2019-07-30 MED ORDER — CEFAZOLIN SODIUM-DEXTROSE 2-3 GM-%(50ML) IV SOLR
INTRAVENOUS | Status: DC | PRN
  Administered 2019-07-30: 2 g via INTRAVENOUS

## 2019-07-30 MED ORDER — BENAZEPRIL HCL 20 MG PO TABS
20.0000 mg | ORAL_TABLET | Freq: Every day | ORAL | Status: DC
  Administered 2019-07-31: 20 mg via ORAL
  Filled 2019-07-30: qty 1

## 2019-07-30 MED ORDER — FENTANYL CITRATE (PF) 100 MCG/2ML IJ SOLN
25.0000 ug | INTRAMUSCULAR | Status: DC | PRN
  Administered 2019-07-30 (×5): 25 ug via INTRAVENOUS

## 2019-07-30 MED ORDER — SODIUM CHLORIDE 0.9 % IV SOLN
INTRAVENOUS | Status: DC
  Administered 2019-07-30: 11:00:00 via INTRAVENOUS

## 2019-07-30 MED ORDER — CEFAZOLIN SODIUM-DEXTROSE 2-4 GM/100ML-% IV SOLN: 2.0000 g | INTRAVENOUS | Status: DC

## 2019-07-30 MED ORDER — ADULT MULTIVITAMIN W/MINERALS CH
1.0000 | ORAL_TABLET | Freq: Every day | ORAL | Status: DC
  Administered 2019-07-31: 1 via ORAL
  Filled 2019-07-30: qty 1

## 2019-07-30 MED ORDER — ONDANSETRON HCL 4 MG/2ML IJ SOLN
INTRAMUSCULAR | Status: AC
  Administered 2019-07-30: 4 mg via INTRAVENOUS
  Filled 2019-07-30: qty 2

## 2019-07-30 MED ORDER — ONDANSETRON HCL 4 MG PO TABS: 4.0000 mg | ORAL_TABLET | ORAL | Status: DC | PRN

## 2019-07-30 MED ORDER — ROCURONIUM BROMIDE 10 MG/ML (PF) SYRINGE
PREFILLED_SYRINGE | INTRAVENOUS | Status: AC
  Filled 2019-07-30: qty 10

## 2019-07-30 MED ORDER — ROCURONIUM BROMIDE 10 MG/ML (PF) SYRINGE
PREFILLED_SYRINGE | INTRAVENOUS | Status: DC | PRN
  Administered 2019-07-30 (×2): 50 mg via INTRAVENOUS

## 2019-07-30 MED ORDER — DEXAMETHASONE SODIUM PHOSPHATE 10 MG/ML IJ SOLN: 10.0000 mg | Freq: Once | INTRAMUSCULAR | Status: DC

## 2019-07-30 MED ORDER — FENTANYL CITRATE (PF) 100 MCG/2ML IJ SOLN
INTRAMUSCULAR | Status: DC | PRN
  Administered 2019-07-30: 100 ug via INTRAVENOUS

## 2019-07-30 MED ORDER — SODIUM CHLORIDE 0.9 % IV SOLN: INTRAVENOUS | Status: DC | PRN

## 2019-07-30 MED ORDER — DOCUSATE SODIUM 100 MG PO CAPS
100.0000 mg | ORAL_CAPSULE | Freq: Two times a day (BID) | ORAL | Status: DC
  Administered 2019-07-30 – 2019-07-31 (×2): 100 mg via ORAL
  Filled 2019-07-30 (×2): qty 1

## 2019-07-30 MED ORDER — ONDANSETRON HCL 4 MG/2ML IJ SOLN
4.0000 mg | Freq: Once | INTRAMUSCULAR | Status: AC | PRN
  Administered 2019-07-30: 14:00:00 4 mg via INTRAVENOUS

## 2019-07-30 MED ORDER — SODIUM CHLORIDE 0.9 % IV SOLN
0.0125 ug/kg/min | INTRAVENOUS | Status: DC
  Administered 2019-07-30: .5 ug/kg/min via INTRAVENOUS
  Filled 2019-07-30 (×3): qty 2000

## 2019-07-30 MED ORDER — MEPERIDINE HCL 25 MG/ML IJ SOLN: 6.2500 mg | INTRAMUSCULAR | Status: DC | PRN

## 2019-07-30 MED ORDER — THROMBIN 5000 UNITS EX SOLR
CUTANEOUS | Status: AC
  Filled 2019-07-30: qty 10000

## 2019-07-30 MED ORDER — SODIUM CHLORIDE 0.9 % IV SOLN
INTRAVENOUS | Status: DC | PRN
  Administered 2019-07-30: 25 ug/min via INTRAVENOUS

## 2019-07-30 MED ORDER — BACITRACIN ZINC 500 UNIT/GM EX OINT
TOPICAL_OINTMENT | CUTANEOUS | Status: DC | PRN
  Administered 2019-07-30: 1 via TOPICAL

## 2019-07-30 MED ORDER — ACETAMINOPHEN 500 MG PO TABS
1000.0000 mg | ORAL_TABLET | Freq: Four times a day (QID) | ORAL | Status: DC | PRN
  Administered 2019-07-30 – 2019-07-31 (×2): 1000 mg via ORAL
  Filled 2019-07-30 (×2): qty 2

## 2019-07-30 MED ORDER — THROMBIN 5000 UNITS EX SOLR
CUTANEOUS | Status: AC
  Filled 2019-07-30: qty 5000

## 2019-07-30 MED ORDER — LIDOCAINE 5 % EX PTCH
1.0000 | MEDICATED_PATCH | Freq: Every day | CUTANEOUS | Status: DC | PRN
  Filled 2019-07-30: qty 1

## 2019-07-30 MED ORDER — TRAMADOL HCL 50 MG PO TABS: 50.0000 mg | ORAL_TABLET | Freq: Four times a day (QID) | ORAL | Status: DC | PRN

## 2019-07-30 MED ORDER — PROMETHAZINE HCL 25 MG PO TABS: 12.5000 mg | ORAL_TABLET | ORAL | Status: DC | PRN

## 2019-07-30 MED ORDER — METOPROLOL TARTRATE 25 MG PO TABS
25.0000 mg | ORAL_TABLET | Freq: Two times a day (BID) | ORAL | Status: DC
  Administered 2019-07-30 – 2019-07-31 (×2): 25 mg via ORAL
  Filled 2019-07-30 (×2): qty 1

## 2019-07-30 MED ORDER — FLUTICASONE PROPIONATE 50 MCG/ACT NA SUSP: 2.0000 | Freq: Every day | NASAL | Status: DC

## 2019-07-30 MED ORDER — LIDOCAINE 2% (20 MG/ML) 5 ML SYRINGE
INTRAMUSCULAR | Status: DC | PRN
  Administered 2019-07-30: 100 mg via INTRAVENOUS

## 2019-07-30 MED ORDER — CENTRUM SILVER ADULT 50+ PO TABS: 1.0000 | ORAL_TABLET | Freq: Every day | ORAL | Status: DC

## 2019-07-30 MED ORDER — DEXAMETHASONE SODIUM PHOSPHATE 4 MG/ML IJ SOLN: 4.0000 mg | Freq: Three times a day (TID) | INTRAMUSCULAR | Status: DC

## 2019-07-30 MED ORDER — SUGAMMADEX SODIUM 200 MG/2ML IV SOLN
INTRAVENOUS | Status: DC | PRN
  Administered 2019-07-30: 200 mg via INTRAVENOUS

## 2019-07-30 MED ORDER — HYDROMORPHONE HCL 1 MG/ML IJ SOLN: 0.5000 mg | INTRAMUSCULAR | Status: DC | PRN

## 2019-07-30 MED ORDER — THROMBIN 5000 UNITS EX SOLR
CUTANEOUS | Status: DC | PRN
  Administered 2019-07-30 (×4): 5000 [IU] via TOPICAL

## 2019-07-30 MED ORDER — MIDAZOLAM HCL 2 MG/2ML IJ SOLN
INTRAMUSCULAR | Status: AC
  Filled 2019-07-30: qty 2

## 2019-07-30 MED ORDER — LABETALOL HCL 5 MG/ML IV SOLN: 10.0000 mg | INTRAVENOUS | Status: DC | PRN

## 2019-07-30 MED ORDER — DIPHENHYDRAMINE HCL 25 MG PO CAPS
25.0000 mg | ORAL_CAPSULE | Freq: Every day | ORAL | Status: DC
  Administered 2019-07-30: 25 mg via ORAL
  Filled 2019-07-30 (×2): qty 1

## 2019-07-30 MED ORDER — SODIUM CHLORIDE 0.9 % IV SOLN
INTRAVENOUS | Status: DC | PRN
  Administered 2019-07-30: 11:00:00 500 mL

## 2019-07-30 MED ORDER — ARTIFICIAL TEARS OPHTHALMIC OINT
TOPICAL_OINTMENT | OPHTHALMIC | Status: AC
  Filled 2019-07-30: qty 7

## 2019-07-30 MED ORDER — HYDROCODONE-ACETAMINOPHEN 5-325 MG PO TABS: 1.0000 | ORAL_TABLET | ORAL | Status: DC | PRN

## 2019-07-30 MED ORDER — GADOBUTROL 1 MMOL/ML IV SOLN
7.0000 mL | Freq: Once | INTRAVENOUS | Status: AC | PRN
  Administered 2019-07-30: 7 mL via INTRAVENOUS

## 2019-07-30 MED ORDER — FAMOTIDINE 20 MG PO TABS
20.0000 mg | ORAL_TABLET | Freq: Every day | ORAL | Status: DC
  Administered 2019-07-30: 20 mg via ORAL
  Filled 2019-07-30 (×2): qty 1

## 2019-07-30 MED ORDER — LIDOCAINE-EPINEPHRINE 1 %-1:100000 IJ SOLN
INTRAMUSCULAR | Status: DC | PRN
  Administered 2019-07-30: 5 mL

## 2019-07-30 MED ORDER — ONDANSETRON HCL 4 MG/2ML IJ SOLN
INTRAMUSCULAR | Status: AC
  Filled 2019-07-30: qty 2

## 2019-07-30 MED ORDER — ONDANSETRON HCL 4 MG/2ML IJ SOLN
INTRAMUSCULAR | Status: DC | PRN
  Administered 2019-07-30: 4 mg via INTRAVENOUS

## 2019-07-30 MED ORDER — AMITRIPTYLINE HCL 10 MG PO TABS
10.0000 mg | ORAL_TABLET | Freq: Every evening | ORAL | Status: DC | PRN
  Filled 2019-07-30: qty 1

## 2019-07-30 MED ORDER — CHLORHEXIDINE GLUCONATE CLOTH 2 % EX PADS
6.0000 | MEDICATED_PAD | Freq: Every day | CUTANEOUS | Status: DC
  Administered 2019-07-30: 6 via TOPICAL

## 2019-07-30 MED ORDER — DEXAMETHASONE 4 MG PO TABS
4.0000 mg | ORAL_TABLET | Freq: Two times a day (BID) | ORAL | Status: DC
  Filled 2019-07-30: qty 1

## 2019-07-30 MED ORDER — LIDOCAINE 2% (20 MG/ML) 5 ML SYRINGE
INTRAMUSCULAR | Status: AC
  Filled 2019-07-30: qty 5

## 2019-07-30 MED ORDER — SUCCINYLCHOLINE CHLORIDE 20 MG/ML IJ SOLN: INTRAMUSCULAR | Status: DC | PRN

## 2019-07-30 MED ORDER — CEFAZOLIN SODIUM-DEXTROSE 2-4 GM/100ML-% IV SOLN
2.0000 g | Freq: Three times a day (TID) | INTRAVENOUS | Status: AC
  Administered 2019-07-30 – 2019-07-31 (×2): 2 g via INTRAVENOUS
  Filled 2019-07-30 (×2): qty 100

## 2019-07-30 MED ORDER — SUCCINYLCHOLINE CHLORIDE 200 MG/10ML IV SOSY
PREFILLED_SYRINGE | INTRAVENOUS | Status: DC | PRN
  Administered 2019-07-30: 120 mg via INTRAVENOUS

## 2019-07-30 MED ORDER — FENTANYL CITRATE (PF) 100 MCG/2ML IJ SOLN
INTRAMUSCULAR | Status: AC
  Filled 2019-07-30: qty 2

## 2019-07-30 MED ORDER — HEMOSTATIC AGENTS (NO CHARGE) OPTIME
TOPICAL | Status: DC | PRN
  Administered 2019-07-30 (×3): 1 via TOPICAL

## 2019-07-30 MED ORDER — LEVETIRACETAM 500 MG PO TABS
500.0000 mg | ORAL_TABLET | Freq: Two times a day (BID) | ORAL | Status: DC
  Administered 2019-07-30 – 2019-07-31 (×2): 500 mg via ORAL
  Filled 2019-07-30 (×2): qty 1

## 2019-07-30 MED ORDER — PANTOPRAZOLE SODIUM 40 MG IV SOLR
40.0000 mg | Freq: Every day | INTRAVENOUS | Status: DC
  Administered 2019-07-30: 40 mg via INTRAVENOUS
  Filled 2019-07-30: qty 40

## 2019-07-30 MED ORDER — FENTANYL CITRATE (PF) 250 MCG/5ML IJ SOLN: INTRAMUSCULAR | Status: DC | PRN

## 2019-07-30 MED ORDER — ATORVASTATIN CALCIUM 40 MG PO TABS
40.0000 mg | ORAL_TABLET | Freq: Every day | ORAL | Status: DC
  Administered 2019-07-30: 40 mg via ORAL
  Filled 2019-07-30: qty 1

## 2019-07-30 MED ORDER — SUCCINYLCHOLINE CHLORIDE 200 MG/10ML IV SOSY
PREFILLED_SYRINGE | INTRAVENOUS | Status: AC
  Filled 2019-07-30: qty 10

## 2019-07-30 MED ORDER — DEXAMETHASONE SODIUM PHOSPHATE 10 MG/ML IJ SOLN
6.0000 mg | Freq: Four times a day (QID) | INTRAMUSCULAR | Status: DC
  Administered 2019-07-31 (×3): 6 mg via INTRAVENOUS
  Filled 2019-07-30 (×3): qty 1

## 2019-07-30 MED ORDER — BUPIVACAINE HCL (PF) 0.25 % IJ SOLN
INTRAMUSCULAR | Status: AC
  Filled 2019-07-30: qty 30

## 2019-07-30 SURGICAL SUPPLY — 59 items
BIT DRILL WIRE PASS 1.3MM (BIT) ×1 IMPLANT
BLADE CLIPPER SURG (BLADE) ×3 IMPLANT
BNDG ADH 1X3 SHEER STRL LF (GAUZE/BANDAGES/DRESSINGS) ×9 IMPLANT
BUR ACORN 6.0 (BURR) IMPLANT
BUR ACORN 6.0MM (BURR)
BUR ACORN 9.0 PRECISION (BURR) ×2 IMPLANT
BUR ACORN 9.0MM PRECISION (BURR) ×1
BUR SPIRAL ROUTER 2.3 (BUR) ×2 IMPLANT
BUR SPIRAL ROUTER 2.3MM (BUR) ×1
CABLE BIPOLOR RESECTION CORD (MISCELLANEOUS) IMPLANT
CANISTER SUCT 3000ML PPV (MISCELLANEOUS) ×3 IMPLANT
CARTRIDGE OIL MAESTRO DRILL (MISCELLANEOUS) ×1 IMPLANT
CLIP RANEY DISP (INSTRUMENTS) ×6 IMPLANT
COVER WAND RF STERILE (DRAPES) ×3 IMPLANT
DIFFUSER DRILL AIR PNEUMATIC (MISCELLANEOUS) ×3 IMPLANT
DRAPE INCISE IOBAN 66X45 STRL (DRAPES) IMPLANT
DRAPE WARM FLUID 44X44 (DRAPES) ×3 IMPLANT
DRILL WIRE PASS 1.3MM (BIT) ×3
DURAPREP 26ML APPLICATOR (WOUND CARE) ×3 IMPLANT
DURAPREP 6ML APPLICATOR 50/CS (WOUND CARE) ×3 IMPLANT
ELECT REM PT RETURN 9FT ADLT (ELECTROSURGICAL) ×3
ELECTRODE REM PT RTRN 9FT ADLT (ELECTROSURGICAL) ×1 IMPLANT
FORCEPS BIPOLAR SPETZLER 8 1.0 (NEUROSURGERY SUPPLIES) ×3 IMPLANT
GAUZE 4X4 16PLY RFD (DISPOSABLE) ×3 IMPLANT
GAUZE SPONGE 4X4 12PLY STRL (GAUZE/BANDAGES/DRESSINGS) ×3 IMPLANT
GLOVE BIO SURGEON STRL SZ7 (GLOVE) IMPLANT
GLOVE BIO SURGEON STRL SZ8 (GLOVE) IMPLANT
GLOVE BIOGEL PI IND STRL 7.0 (GLOVE) IMPLANT
GLOVE BIOGEL PI INDICATOR 7.0 (GLOVE)
GLOVE INDICATOR 8.5 STRL (GLOVE) IMPLANT
GLOVE SURG SS PI 6.5 STRL IVOR (GLOVE) ×6 IMPLANT
GLOVE SURG SS PI 7.5 STRL IVOR (GLOVE) ×12 IMPLANT
GLOVE SURG SS PI 8.0 STRL IVOR (GLOVE) ×6 IMPLANT
GOWN STRL REUS W/ TWL LRG LVL3 (GOWN DISPOSABLE) ×3 IMPLANT
GOWN STRL REUS W/ TWL XL LVL3 (GOWN DISPOSABLE) ×1 IMPLANT
GOWN STRL REUS W/TWL 2XL LVL3 (GOWN DISPOSABLE) IMPLANT
GOWN STRL REUS W/TWL LRG LVL3 (GOWN DISPOSABLE) ×6
GOWN STRL REUS W/TWL XL LVL3 (GOWN DISPOSABLE) ×2
HOOK DURA 1/2IN (MISCELLANEOUS) ×3 IMPLANT
KIT BASIN OR (CUSTOM PROCEDURE TRAY) ×3 IMPLANT
KIT TURNOVER KIT B (KITS) ×3 IMPLANT
MARKER SPHERE PSV REFLC 13MM (MARKER) ×9 IMPLANT
NEEDLE HYPO 25X1 1.5 SAFETY (NEEDLE) ×3 IMPLANT
NS IRRIG 1000ML POUR BTL (IV SOLUTION) ×6 IMPLANT
OIL CARTRIDGE MAESTRO DRILL (MISCELLANEOUS) ×3
PACK LAMINECTOMY NEURO (CUSTOM PROCEDURE TRAY) ×3 IMPLANT
PAD ARMBOARD 7.5X6 YLW CONV (MISCELLANEOUS) IMPLANT
PLATE 1.5 2H 17 DOU T (Plate) ×3 IMPLANT
PLATE 1.5/0.5 18.5MM BURR HOLE (Plate) ×3 IMPLANT
SCREW SELF DRILL HT 1.5/4MM (Screw) ×27 IMPLANT
SPONGE SURGIFOAM ABS GEL SZ50 (HEMOSTASIS) ×6 IMPLANT
STAPLER SKIN PROX WIDE 3.9 (STAPLE) ×3 IMPLANT
SUT NURALON 4 0 TR CR/8 (SUTURE) ×6 IMPLANT
SUT VIC AB 2-0 CP2 18 (SUTURE) ×3 IMPLANT
SYR 5ML LUER SLIP (SYRINGE) ×6 IMPLANT
TAPE CLOTH SURG 4X10 WHT LF (GAUZE/BANDAGES/DRESSINGS) ×3 IMPLANT
TOWEL GREEN STERILE (TOWEL DISPOSABLE) ×3 IMPLANT
TOWEL GREEN STERILE FF (TOWEL DISPOSABLE) ×3 IMPLANT
WATER STERILE IRR 1000ML POUR (IV SOLUTION) ×3 IMPLANT

## 2019-07-30 NOTE — Anesthesia Procedure Notes (Signed)
Arterial Line Insertion Start/End9/23/2020 10:30 AM, 07/30/2019 11:00 AM Performed by: Valda Favia, CRNA, CRNA  Patient location: Pre-op. Preanesthetic checklist: patient identified, IV checked, site marked, risks and benefits discussed, surgical consent, monitors and equipment checked, pre-op evaluation, timeout performed and anesthesia consent Lidocaine 1% used for infiltration Left, radial was placed Catheter size: 20 G Hand hygiene performed , maximum sterile barriers used  and Seldinger technique used  Attempts: 3 (Previous 2 attempts per RF CRNA w/o success.) Procedure performed without using ultrasound guided technique. Following insertion, Biopatch and dressing applied. Post procedure assessment: normal  Patient tolerated the procedure well with no immediate complications.

## 2019-07-30 NOTE — Op Note (Signed)
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  On repeat imaging for BrainLab protocol there were new enhancing regions targeted 1 of these posterior enhancing regions but I believe to be behind the motor strip in the parietal lobe.  I recommended open excisional biopsy of this lesion.  I extensively went over the risks and benefits of the procedure with him as well as perioperative course expectations of outcome and alternatives of surgery and he understood and agreed to proceed forward.  Operative procedure: Patient was brought into the OR was additional general anesthesia positioned supine head turned slightly to the left and neck flexed the right parietal lobe was shaved prepped and draped in routine sterile fashion after BrainLab localization registered patient with the stereotactic imaging is all 4 after adequate registration has been achieved his head was shaved and prepped registration was confirmed and a linear incision was made and then a craniotomy was identified again utilizing [place] with with single posterior bur hole turned a small crania automate flap again localized the lesion under the dura opened up the dura in a cruciate fashion.  Then there was swollen gyri and the enhancing lesion appear to be in the posterior gyrus so I incised utilizing pedal margins the posterior gyrus and utilizing the stereotactic navigation system took several specimens and sent down for frozen.  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  I had taken  some additional specimen while I was waiting for pathology to go for permanent with confirmation of lesional tissue I then copiously irrigated the wound placed dural tack ups close the dura and then laid the bone flap on over a layer of Gelfoam.  Then closed the scalp with interrupted 5-0 Vicryl and a running nylon.  At the end the case on needle count sponge counts were correct.

## 2019-07-30 NOTE — Transfer of Care (Signed)
Immediate Anesthesia Transfer of Care Note  Patient: Gabriel Atkins  Procedure(s) Performed: Right Parietal craniotomy with brainlab (Right ) APPLICATION OF CRANIAL NAVIGATION (Right )  Patient Location: PACU  Anesthesia Type:General  Level of Consciousness: awake, oriented, patient cooperative and responds to stimulation  Airway & Oxygen Therapy: Patient Spontanous Breathing and Patient connected to nasal cannula oxygen  Post-op Assessment: Report given to RN, Post -op Vital signs reviewed and stable and Patient moving all extremities X 4  Post vital signs: Reviewed and stable  Last Vitals:  Vitals Value Taken Time  BP 142/89 07/30/19 1341  Temp    Pulse 71 07/30/19 1346  Resp 13 07/30/19 1346  SpO2 98 % 07/30/19 1346  Vitals shown include unvalidated device data.  Last Pain:  Vitals:   07/30/19 1039  TempSrc:   PainSc: 0-No pain         Complications: No apparent anesthesia complications

## 2019-07-30 NOTE — Anesthesia Postprocedure Evaluation (Signed)
Anesthesia Post Note  Patient: Gabriel Atkins, music  Procedure(s) Performed: Right Parietal craniotomy with brainlab (Right ) APPLICATION OF CRANIAL NAVIGATION (Right )     Patient location during evaluation: PACU Anesthesia Type: General Level of consciousness: sedated and patient cooperative Pain management: pain level controlled Vital Signs Assessment: post-procedure vital signs reviewed and stable Respiratory status: spontaneous breathing Cardiovascular status: stable Anesthetic complications: no    Last Vitals:  Vitals:   07/30/19 1513 07/30/19 1515  BP: 133/79   Pulse: (!) 57 61  Resp: 19 (!) 22  Temp:    SpO2: 99% 100%    Last Pain:  Vitals:   07/30/19 1515  TempSrc:   PainSc: 2                  Nolon Nations

## 2019-07-30 NOTE — Anesthesia Preprocedure Evaluation (Signed)
Anesthesia Evaluation  Patient identified by MRN, date of birth, ID band Patient awake    Reviewed: Allergy & Precautions, NPO status , Patient's Chart, lab work & pertinent test results, reviewed documented beta blocker date and time   History of Anesthesia Complications Negative for: history of anesthetic complications  Airway Mallampati: II  TM Distance: >3 FB Neck ROM: Full    Dental  (+) Edentulous Upper, Poor Dentition, Missing, Dental Advisory Given, Chipped   Pulmonary COPD, former smoker,  05/19/2019 SARS coronavirus NEG   breath sounds clear to auscultation       Cardiovascular hypertension, Pt. on medications and Pt. on home beta blockers (-) angina Rhythm:Regular Rate:Normal     Neuro/Psych Seizures -,  cervicalgia    GI/Hepatic Neg liver ROS, hiatal hernia, GERD  Medicated and Controlled,  Endo/Other  negative endocrine ROS  Renal/GU negative Renal ROS     Musculoskeletal  (+) Arthritis ,   Abdominal   Peds  Hematology negative hematology ROS (+)   Anesthesia Other Findings   Reproductive/Obstetrics                             Anesthesia Physical  Anesthesia Plan  ASA: III  Anesthesia Plan: General   Post-op Pain Management:    Induction: Intravenous  PONV Risk Score and Plan: 3 and Scopolamine patch - Pre-op, Dexamethasone and Ondansetron  Airway Management Planned: Oral ETT  Additional Equipment: Arterial line  Intra-op Plan:   Post-operative Plan: Extubation in OR  Informed Consent: I have reviewed the patients History and Physical, chart, labs and discussed the procedure including the risks, benefits and alternatives for the proposed anesthesia with the patient or authorized representative who has indicated his/her understanding and acceptance.     Dental advisory given  Plan Discussed with: CRNA and Surgeon  Anesthesia Plan Comments: (S/p recent ACDF  C3-C7 on 05/11/2019 by Dr. Vista Deck had been recovering well but presented to [facility] on 9/12withleft sided weakness and aphasia. Head CT at [facility] was consistent with right hemispheric mass and was felt to be unlikely secondary to stroke. Patient was [facility] and started on keppra and decadron. MRI brainwas obtained which showed findingsmost c/w infiltrating primary brain neoplasm.Neurosurgery recommended outpatient stereotactic biopsy. CT chest on 9/13 showed spiculated partially cavitating 2.3 cm nodule in the left upper lobe, highly concerning for primary bronchogenic carcinoma. Pulm recommended outpatient bronchoscopic biopsy.  Per [name] at Dr. Windy Carina office, the pt has been prescribed Valium 5mg  to take 30 min prior to arrival.  )       Anesthesia Quick Evaluation

## 2019-07-30 NOTE — H&P (Signed)
Gabriel Atkins is an 68 y.o. male.   Chief Complaint: Left arm numbness tingling left-sided weakness HPI: 68 year old gentleman who presented to the ER visit with an episode of seizure affecting the left side of his body.  Work-up revealed a nonenhancing mass in the right frontal parietal and temporal.  In addition metastatic work-up showed a questionable lesion in his lung.  Patient is currently being evaluated by pulmonary but also presents for biopsy of intracranial mass.  Extensively gone over the risks and benefits of stereotactic craniotomy for possible open biopsy versus needle biopsy of the right frontoparietal.  Also gone over the perioperative course expectations of outcome and terms of surgery and he understands and agrees to proceed forward.  Past Medical History:  Diagnosis Date  . Arthritis   . GERD (gastroesophageal reflux disease)   . History of hiatal hernia   . Hypertension   . Lung nodule 07/2019    Past Surgical History:  Procedure Laterality Date  . ANTERIOR CERVICAL DECOMPRESSION/DISCECTOMY FUSION 4 LEVELS N/A 05/21/2019   Procedure: ANTERIOR CERVICAL DECOMPRESSION/DISCECTOMY FUSION - CERVICAL THREE-FOUR, CERVICAL FOUR-FIVE, CERVICAL FIVE-SIX, CERVICAL SIX-SEVEN;  Surgeon: Kary Kos, MD;  Location: [place];  Service: Neurosurgery;  Laterality: N/A;  . CYST EXCISION     Left Groin  . SPLENECTOMY, TOTAL      No family history on file. Social History:  reports that he has quit smoking. He smoked 2.00 packs per day. He has never used smokeless tobacco. He reports previous alcohol use. He reports that he does not use drugs.  Allergies:  Allergies  Allergen Reactions  . Ciprofloxacin     Headaches, dizziness, cold sweats    . Codeine Nausea And Vomiting    Headaches, dizziness, cold sweats    . Morphine And Related     "doesn't work"  . Latex Rash  . Oxycodone Nausea Only and Other (See Comments)    Hallucinations  . Pentazocine Nausea And Vomiting     Medications Prior to Admission  Medication Sig Dispense Refill  . acetaminophen (TYLENOL) 500 MG tablet Take 1,000 mg by mouth every 6 (six) hours as needed for moderate pain or headache.    Marland Kitchen atorvastatin (LIPITOR) 40 MG tablet Take 40 mg by mouth at bedtime.    . benazepril (LOTENSIN) 20 MG tablet Take 20 mg by mouth daily.    Marland Kitchen dexamethasone (DECADRON) 4 MG tablet Take 1 tablet (4 mg total) by mouth every 12 (twelve) hours. 14 tablet 0  . diphenhydrAMINE (BENADRYL) 25 MG tablet Take 25 mg by mouth at bedtime.    . famotidine (PEPCID) 20 MG tablet Take 20 mg by mouth at bedtime.    . levETIRAcetam (KEPPRA) 500 MG tablet Take 1 tablet (500 mg total) by mouth 2 (two) times daily. 60 tablet 0  . loratadine (CLARITIN) 10 MG tablet Take 10 mg by mouth daily as needed for allergies.    . methocarbamol (ROBAXIN) 750 MG tablet Take 1 tablet by mouth 3 (three) times daily as needed.    . metoprolol tartrate (LOPRESSOR) 25 MG tablet Take 25 mg by mouth 2 (two) times daily.    . miconazole (MICOTIN) 2 % cream Apply 1 application topically daily as needed.     . Multiple Vitamins-Minerals (CENTRUM SILVER ADULT 50+) TABS Take 1 tablet by mouth daily.    . promethazine (PHENERGAN) 12.5 MG tablet Take 1 tablet by mouth 3 (three) times daily as needed.    . traMADol (ULTRAM) 50 MG tablet  Take 1 tablet by mouth 4 (four) times daily as needed.    Marland Kitchen amitriptyline (ELAVIL) 10 MG tablet Take 10 mg by mouth at bedtime as needed for sleep.    . Lidocaine (BLUE-EMU PAIN RELIEF DRY EX) Apply 1 application topically daily as needed (pain).    . mometasone (NASONEX) 50 MCG/ACT nasal spray Place 1-2 sprays into the nose daily as needed.      Results for orders placed or performed during the hospital encounter of 07/28/19 (from the past 48 hour(s))  SARS CORONAVIRUS 2 (TAT 6-24 HRS) Nasopharyngeal Nasopharyngeal Swab     Status: None   Collection Time: 07/28/19  1:22 PM   Specimen: Nasopharyngeal Swab  Result  Value Ref Range   SARS Coronavirus 2 NEGATIVE NEGATIVE    Comment: (NOTE) SARS-CoV-2 target nucleic acids are NOT DETECTED. The SARS-CoV-2 RNA is generally detectable in upper and lower respiratory specimens during the acute phase of infection. Negative results do not preclude SARS-CoV-2 infection, do not rule out co-infections with other pathogens, and should not be used as the sole basis for treatment or other patient management decisions. Negative results must be combined with clinical observations, patient history, and epidemiological information. The expected result is Negative. Fact Sheet for Patients: SugarRoll.be Fact Sheet for Healthcare Providers: https://www.woods-mathews.com/ This test is not yet approved or cleared by the [place] FDA and  has been authorized for detection and/or diagnosis of SARS-CoV-2 by FDA under an Emergency Use Authorization (EUA). This EUA will remain  in effect (meaning this test can be used) for the duration of the COVID-19 declaration under Section 56 4(b)(1) of the Act, 21 U.S.C. section 360bbb-3(b)(1), unless the authorization is terminated or revoked sooner. Performed at [facility], [place] [address], [place], [place] [address]    No results found.  Review of Systems  Neurological: Positive for tingling, sensory change and focal weakness.    Blood pressure (!) 162/92, pulse (!) 53, temperature 97.9 F (36.6 C), temperature source Oral, resp. rate 18, SpO2 98 %. Physical Exam  Constitutional: He is oriented to person, place, and time. He appears well-developed.  Eyes: Pupils are equal, round, and reactive to light.  Neck: Normal range of motion.  Respiratory: Effort normal.  GI: Soft.  Neurological: He is alert and oriented to person, place, and time. He has normal strength. GCS eye subscore is 4. GCS verbal subscore is 5. GCS motor subscore is 6.  Patient is awake and alert pupils  equal extra movements are intact cranial nerves are intact strength is 5 out of 5 right upper and lower extremity.  Left side has some left upper extremity apraxia and numbness strength appears to be 5 out of 5  Skin: Skin is warm and dry.     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

## 2019-07-31 ENCOUNTER — Encounter (HOSPITAL_COMMUNITY): Payer: Self-pay | Admitting: Neurosurgery

## 2019-07-31 ENCOUNTER — Inpatient Hospital Stay (HOSPITAL_COMMUNITY): Payer: Medicare Other

## 2019-07-31 LAB — BASIC METABOLIC PANEL
Anion gap: 10 (ref 5–15)
BUN: 18 mg/dL (ref 8–23)
CO2: 18 mmol/L — ABNORMAL LOW (ref 22–32)
Calcium: 8.2 mg/dL — ABNORMAL LOW (ref 8.9–10.3)
Chloride: 108 mmol/L (ref 98–111)
Creatinine, Ser: 0.88 mg/dL (ref 0.61–1.24)
GFR calc Af Amer: >60 mL/min (ref >60)
GFR calc non Af Amer: >60 mL/min (ref >60)
Glucose, Bld: 152 mg/dL — ABNORMAL HIGH (ref 70–99)
Potassium: 4.3 mmol/L (ref 3.5–5.1)
Sodium: 136 mmol/L (ref 135–145)

## 2019-07-31 LAB — MRSA PCR SCREENING: MRSA by PCR: NEGATIVE

## 2019-07-31 NOTE — Discharge Summary (Signed)
Physician Discharge Summary  Patient ID: Gabriel Atkins MRN: [id number] DOB/AGE: 07/15/1951 68 y.o. Estimated body mass index is 25.98 kg/m as calculated from the following:   Height as of this encounter: 5\' 9"  (1.753 m).   Weight as of this encounter: 79.8 kg.   Admit date: 07/30/2019 Discharge date: 07/31/2019  Admission Diagnoses: Right frontoparietal brain tumor  Discharge Diagnoses: Active Problems:   Brain tumor [facility])   Discharged Condition: good  Hospital Course: Patient was admitted to the hospital underwent open craniotomy for biopsy postop patient did very well recovering the ICU in the ICU was ambulating voiding spontaneously tolerating regular diet stable for discharge home.  Postop CT scan looked good no complications.  Consults: Significant Diagnostic Studies: Treatments: Craniotomy for open biopsy Discharge Exam: Blood pressure 119/68, pulse (!) 49, temperature 98 F (36.7 C), temperature source Oral, resp. rate 11, height 5\' 9"  (1.753 m), weight 79.8 kg, SpO2 96 %. Strength 5/5 wound clean dry and intact  Disposition: Home   Allergies as of 07/31/2019      Reactions   Ciprofloxacin    Headaches, dizziness, cold sweats    Codeine Nausea And Vomiting   Headaches, dizziness, cold sweats    Morphine And Related    "doesn't work"   Latex Rash   Oxycodone Nausea Only, Other (See Comments)   Hallucinations   Pentazocine Nausea And Vomiting      Medication List    TAKE these medications   acetaminophen 500 MG tablet Commonly known as: TYLENOL Take 1,000 mg by mouth every 6 (six) hours as needed for moderate pain or headache.   amitriptyline 10 MG tablet Commonly known as: ELAVIL Take 10 mg by mouth at bedtime as needed for sleep.   atorvastatin 40 MG tablet Commonly known as: LIPITOR Take 40 mg by mouth at bedtime.   benazepril 20 MG tablet Commonly known as: LOTENSIN Take 20 mg by mouth daily.   BLUE-EMU PAIN RELIEF DRY EX Apply 1 application  topically daily as needed (pain).   Centrum Silver Adult 50+ Tabs Take 1 tablet by mouth daily.   dexamethasone 4 MG tablet Commonly known as: DECADRON Take 1 tablet (4 mg total) by mouth every 12 (twelve) hours.   diphenhydrAMINE 25 MG tablet Commonly known as: BENADRYL Take 25 mg by mouth at bedtime.   famotidine 20 MG tablet Commonly known as: PEPCID Take 20 mg by mouth at bedtime.   levETIRAcetam 500 MG tablet Commonly known as: Keppra Take 1 tablet (500 mg total) by mouth 2 (two) times daily.   loratadine 10 MG tablet Commonly known as: CLARITIN Take 10 mg by mouth daily as needed for allergies.   methocarbamol 750 MG tablet Commonly known as: ROBAXIN Take 1 tablet by mouth 3 (three) times daily as needed.   metoprolol tartrate 25 MG tablet Commonly known as: LOPRESSOR Take 25 mg by mouth 2 (two) times daily.   miconazole 2 % cream Commonly known as: MICOTIN Apply 1 application topically daily as needed.   mometasone 50 MCG/ACT nasal spray Commonly known as: NASONEX Place 1-2 sprays into the nose daily as needed.   promethazine 12.5 MG tablet Commonly known as: PHENERGAN Take 1 tablet by mouth 3 (three) times daily as needed.   traMADol 50 MG tablet Commonly known as: ULTRAM Take 1 tablet by mouth 4 (four) times daily as needed.        Signed: Avari Gelles P 07/31/2019, 8:28 AM

## 2019-07-31 NOTE — Progress Notes (Signed)
Ambulated patient in hallway after removing foley and a-line. Patient tolerated well.  Patient now dressed and waiting for ride. Will continue to monitor Hiram Gash RN

## 2019-08-04 ENCOUNTER — Telehealth: Payer: Self-pay

## 2019-08-04 NOTE — Telephone Encounter (Signed)
-----   Message from Garner Nash, DO sent at 08/03/2019 11:27 AM EDT ----- Regarding: RE: Lung mass [place],  Please schedule appt with me next available New consult for lung mass bronch eval Thanks for the referral  Thanks Leory Plowman   ----- Message ----- From: Maryanna Shape, NP Sent: 07/24/2019   1:04 PM EDT To: Garner Nash, DO Subject: Lung mass                                      Dr. Valeta Harms  This is the patient that I mentioned that has a brain mass and lung mass. Likely 2 primaries. NS to do biopsy of brain mass next week. May need a bronch as some point for the lung mass. We were focused on the brain mass - trying to determine prognosis. Would look at doing bronch down the road depending on what we find.

## 2019-08-04 NOTE — Telephone Encounter (Signed)
LMTCB. Please schedule patient a consult visit with BI 10/05 or 10/06. Thanks.

## 2019-08-07 ENCOUNTER — Other Ambulatory Visit: Payer: Self-pay | Admitting: *Deleted

## 2019-08-07 DIAGNOSIS — C3412 Malignant neoplasm of upper lobe, left bronchus or lung: Secondary | ICD-10-CM

## 2019-08-07 NOTE — Progress Notes (Signed)
HEMATOLOGY/ONCOLOGY CONSULTATION NOTE  Date of Service: 08/07/2019  Patient Care Team: Patient, No Pcp Per as PCP - General (General Practice)  REFERRING PHYSICIAN: No ref. provider found  CHIEF COMPLAINTS/PURPOSE OF CONSULTATION:  Brain tumor Lung mass   HISTORY OF PRESENTING ILLNESS:  Gabriel Atkins is a wonderful 68 y.o. male who has been referred to [place] for evaluation and management of brain tumor and lung mass.  The pt reports he had a fall the day of his hospital admission and was noted to have left-sided weakness and aphasia.  The patient does not have any recollection of these events and history was obtained from his wife during initial consultation.  The patient's wife noted that he had generalized shaking in his room as well as some jerking of his left arm.  A code stroke was called.  Of note prior to the patient's visit today, pt has had head CT completed on 07/19/2019 with results revealing a masslike abnormality in the right frontal-parietal lobe with some vasogenic edema and 8 mm midline shift.   Pt had a brain MRI completed on 07/19/2019 with results revealing a large up to 9 cm infiltrative, predominantly T2 hyperintense mass in the right hemisphere with multilobar involvement.  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  High-grade astrocytoma and GBM are also considerations.  CNS lymphoma less likely.  Pt had C/A/P CT completed on 07/20/2019 with results revealing a spiculated partially cavitating 2.3 cm nodule in the left upper lobe of the lung which was concerning for primary bronchogenic carcinoma.  There are also a few prominent mediastinal and left hilar lymph nodes which are nonspecific but metastatic disease cannot be excluded.  There is no evidence of metastatic disease within the abdomen or pelvis..    On review of systems, pt reports numbness and weakness in his left arm  and denies anorexia, weight loss, night  sweats, headaches, dizziness, chest discomfort, shortness of breath, cough, abdominal pain, nausea, vomiting, constipation, diarrhea, epistaxis, hemoptysis, hematemesis, hematuria, melena, hematochezia and any other symptoms.   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  On Social Hx the pt reports he is married and lives with his wife.  He has 1 daughter who lives in [place].  He denies prior alcohol use.  Smoked 2 packs of cigarettes for approximately 40 years.   On Family Hx the pt reports throat and prostate cancer in his father.   INTERVAL HISTORY:  Gabriel Atkins is a 68 y.o. male here for evaluation and management of brain tumor and lung mass for his post hospital followup.  Of note since the patient's last visit, pt has had brain MRI completed on 07/30/2019 with results revealing "Two separate cortical foci of enhancement demonstrated. There is restricted diffusion associated with the more posterior area, within the right parietal lobe. Findings are characteristic of either high-grade primary neoplasm or metastatic disease associated with a small cell lung cancer. Similar appearance of extensive cerebral edema and mass effect. Tract tire fee was performed demonstrate displacement without disruption of major white matter tracts."  Pt had a biopsy of brain lesion completed on 07/30/2019 with results revealing prelim high grade astrocytoma  CT chest 07/20/2019 - showed 2.3 cm spiculated mass in the Left upper lung. Concerning for primary lung cancer.  On review of systems, pt reports left upper  partial seizure and related weakness. Tolerated stereotactic brain bx well.   MEDICAL HISTORY:  Past Medical History:  Diagnosis Date   Arthritis    GERD (gastroesophageal reflux disease)    History of hiatal hernia     Hypertension    Lung nodule 07/2019     SURGICAL HISTORY: Past Surgical History:  Procedure Laterality Date   ANTERIOR CERVICAL DECOMPRESSION/DISCECTOMY FUSION 4 LEVELS N/A 05/21/2019   Procedure: ANTERIOR CERVICAL DECOMPRESSION/DISCECTOMY FUSION - CERVICAL THREE-FOUR, CERVICAL FOUR-FIVE, CERVICAL FIVE-SIX, CERVICAL SIX-SEVEN;  Surgeon: Kary Kos, MD;  Location: [facility];  Service: Neurosurgery;  Laterality: N/A;   APPLICATION OF CRANIAL NAVIGATION Right 07/30/2019   Procedure: APPLICATION OF CRANIAL NAVIGATION;  Surgeon: Kary Kos, MD;  Location: [facility];  Service: Neurosurgery;  Laterality: Right;   BRAIN BIOPSY Right 07/30/2019   Procedure: Right Parietal craniotomy with brainlab;  Surgeon: Kary Kos, MD;  Location: [facility];  Service: Neurosurgery;  Laterality: Right;   CYST EXCISION     Left Groin   SPLENECTOMY, TOTAL       SOCIAL HISTORY: Social History   Socioeconomic History   Marital status: Married    Spouse name: Not on file   Number of children: Not on file   Years of education: Not on file   Highest education level: Not on file  Occupational History   Not on file  Social Needs   Financial resource strain: Patient refused   Food insecurity    Worry: Never true    Inability: Never true   Transportation needs    Medical: No    Non-medical: No  Tobacco Use   Smoking status: Former Smoker    Packs/day: 2.00   Smokeless tobacco: Never Used  Substance and Sexual Activity   Alcohol use: Not Currently   Drug use: Never   Sexual activity: Not on file  Lifestyle   Physical activity    Days per week: Patient refused    Minutes per session: Patient refused   Stress: Not on file  Relationships   Social connections    Talks on phone: Patient refused    Gets together: Patient refused    Attends religious service: Patient refused    Active member of club or organization: Patient refused    Attends meetings of clubs or organizations: Patient refused      Relationship status: Patient refused   Intimate partner violence    Fear of current or ex partner: Patient refused    Emotionally abused: Patient refused    Physically abused: Patient refused    Forced sexual activity: Patient refused  Other Topics Concern   Not on file  Social History Narrative   Not on file     FAMILY HISTORY: No family history on file.   ALLERGIES:   is allergic to ciprofloxacin; codeine; morphine and related; latex; oxycodone; and pentazocine.   MEDICATIONS:  Current Outpatient Medications  Medication Sig Dispense Refill   acetaminophen (TYLENOL) 500 MG tablet Take 1,000 mg by mouth every 6 (six) hours as needed for moderate pain or headache.     amitriptyline (ELAVIL) 10 MG tablet Take 10 mg by mouth at bedtime as needed for sleep.     atorvastatin (LIPITOR) 40 MG tablet Take 40 mg by mouth at bedtime.     benazepril (LOTENSIN) 20 MG tablet Take 20 mg by mouth daily.     dexamethasone (DECADRON) 4 MG tablet Take 1 tablet (4 mg total) by mouth every 12 (twelve) hours. 14 tablet 0  diphenhydrAMINE (BENADRYL) 25 MG tablet Take 25 mg by mouth at bedtime.     famotidine (PEPCID) 20 MG tablet Take 20 mg by mouth at bedtime.     levETIRAcetam (KEPPRA) 500 MG tablet Take 1 tablet (500 mg total) by mouth 2 (two) times daily. 60 tablet 0   Lidocaine (BLUE-EMU PAIN RELIEF DRY EX) Apply 1 application topically daily as needed (pain).     loratadine (CLARITIN) 10 MG tablet Take 10 mg by mouth daily as needed for allergies.     methocarbamol (ROBAXIN) 750 MG tablet Take 1 tablet by mouth 3 (three) times daily as needed.     metoprolol tartrate (LOPRESSOR) 25 MG tablet Take 25 mg by mouth 2 (two) times daily.     miconazole (MICOTIN) 2 % cream Apply 1 application topically daily as needed.      mometasone (NASONEX) 50 MCG/ACT nasal spray Place 1-2 sprays into the nose daily as needed.     Multiple Vitamins-Minerals (CENTRUM SILVER ADULT 50+) TABS  Take 1 tablet by mouth daily.     promethazine (PHENERGAN) 12.5 MG tablet Take 1 tablet by mouth 3 (three) times daily as needed.     traMADol (ULTRAM) 50 MG tablet Take 1 tablet by mouth 4 (four) times daily as needed.     No current facility-administered medications for this visit.      REVIEW OF SYSTEMS:   A 10+ POINT REVIEW OF SYSTEMS WAS OBTAINED including neurology, dermatology, psychiatry, cardiac, respiratory, lymph, extremities, GI, GU, Musculoskeletal, constitutional, breasts, reproductive, HEENT.  All pertinent positives are noted in the HPI.  All others are negative.    PHYSICAL EXAMINATION: ECOG PERFORMANCE STATUS: 2 - Symptomatic, <50% confined to bed  Vitals:   08/08/19 1153  BP: 114/71  Pulse: 86  Resp: 18  Temp: 98.9 F (37.2 C)  SpO2: 98%   Filed Weights   08/08/19 1153  Weight: 170 lb 11.2 oz (77.4 kg)   Body mass index is 25.21 kg/m.  GENERAL:alert, in no acute distress and comfortable SKIN: no acute rashes, no significant lesions EYES: conjunctiva are pink and non-injected, sclera anicteric OROPHARYNX: MMM, no exudates, no oropharyngeal erythema or ulceration NECK: supple, no JVD LYMPH:  no palpable lymphadenopathy in the cervical, axillary or inguinal regions LUNGS: clear to auscultation b/l with normal respiratory effort HEART: regular rate & rhythm ABDOMEN:  normoactive bowel sounds , non tender, not distended. Extremity: no pedal edema PSYCH: alert & oriented x 3 with fluent speech NEURO: left upper extremity clumpsiness.   LABORATORY DATA:  I have reviewed the data as listed  CBC Latest Ref Rng & Units 08/08/2019 07/30/2019 07/20/2019  WBC 4.0 - 10.5 K/uL 14.0(H) 14.3(H) 13.6(H)  Hemoglobin 13.0 - 17.0 g/dL 15.6 13.7 13.0  Hematocrit 39.0 - 52.0 % 47.3 40.4 40.0  Platelets 150 - 400 K/uL 180 170 211    CMP Latest Ref Rng & Units 08/08/2019 07/31/2019 07/30/2019  Glucose 70 - 99 mg/dL 155(H) 152(H) 154(H)  BUN 8 - 23 mg/dL 21 18 19     Creatinine 0.61 - 1.24 mg/dL 0.97 0.88 1.08  Sodium 135 - 145 mmol/L 136 136 136  Potassium 3.5 - 5.1 mmol/L 4.6 4.3 4.4  Chloride 98 - 111 mmol/L 104 108 108  CO2 22 - 32 mmol/L 23 18(L) 21(L)  Calcium 8.9 - 10.3 mg/dL 9.0 8.2(L) 8.2(L)  Total Protein 6.5 - 8.1 g/dL 6.6 - -  Total Bilirubin 0.3 - 1.2 mg/dL 1.4(H) - -  Alkaline Phos 38 -  126 U/L 104 - -  AST 15 - 41 U/L 27 - -  ALT 0 - 44 U/L 64(H) - -   Brain mass biopsy-prelim high grade astrocytoma -- final results pending.   RADIOGRAPHIC STUDIES: I have personally reviewed the radiological images as listed and agreed with the findings in the report. Ct Head Wo Contrast  Result Date: 07/31/2019 CLINICAL DATA:  Follow-up biopsy for brain tumor. EXAM: CT HEAD WITHOUT CONTRAST TECHNIQUE: Contiguous axial images were obtained from the base of the skull through the vertex without intravenous contrast. COMPARISON:  MRI 07/30/2019 FINDINGS: Brain: Interval right parietal craniotomy for excision or biopsy. Biopsy defect measures about 1.5 cm in size. No evidence of hemorrhage or unexpected intracranial air. Extensive right frontoparietal mass as seen on the previous studies, with right-to-left shift of 7 mm. No other change. Vascular: No abnormal vascular finding. Skull: No abnormality other than the craniotomy. Sinuses/Orbits: Clear/normal Other: None IMPRESSION: Interval right parietal craniotomy for biopsy. No unexpected finding. Post biopsy defect measures about 1.5 cm in size. No hemorrhage or unexpected intracranial air or extra-axial collection. Electronically Signed   By: Nelson Chimes M.D.   On: 07/31/2019 07:33   Ct Chest W Contrast  Result Date: 07/20/2019 CLINICAL DATA:  CNS neoplasm, evaluate for primary malignancy EXAM: CT CHEST, ABDOMEN, AND PELVIS WITH CONTRAST TECHNIQUE: Multidetector CT imaging of the chest, abdomen and pelvis was performed following the standard protocol during bolus administration of intravenous contrast.  CONTRAST:  181mL OMNIPAQUE IOHEXOL 300 MG/ML  SOLN COMPARISON:  CT abdomen pelvis 09/12/2016, MR thoracic spine 01/07/2019 FINDINGS: CT CHEST FINDINGS Cardiovascular: Normal heart size. No pericardial effusion. Atherosclerotic calcification of the coronary arteries. Atherosclerotic plaque within the normal caliber aorta. Calcifications present in the proximal great vessels as well. Central pulmonary arteries are normal caliber. Mediastinum/Nodes: Few prominent lymph nodes include a in 8 mm precarinal lymph node (1/29) and a 9 mm left hilar lymph node (1/37). No axillary adenopathy. Thyroid gland and thoracic inlet are unremarkable. Trachea is free of abnormality. Small amount of venous collateralization noted about the distal thoracic esophagus. Esophagus is otherwise unremarkable. Lungs/Pleura: There is a spiculated partially cavitating nodule in the left upper lobe measuring 2.3 x 1.9 x 1.6 cm in size. Extension of this lesion is seen to the adjacent subpleural surface and towards the major fissure. Bandlike areas of scarring and/or atelectasis are present in the left lower lobe and right middle lobe. There is mild apical predominant centrilobular emphysematous change. No other suspicious nodules or masses. Musculoskeletal: Prior anterior cervical spinal fusion. Remote compression deformity versus Schmorl's node formation noted at the T10 vertebral body. Nose acute fracture, vertebral body height loss or suspicious osseous lesion. No concerning chest wall soft tissue abnormality. CT ABDOMEN PELVIS FINDINGS Hepatobiliary: No visible or concerning hepatic lesions. Gallbladder is largely decompressed at the time of exam. No frank gallbladder wall thickening, pericholecystic fluid or inflammation or distal calcified gallstones either within the gallbladder lumen or biliary tree. Pancreas: Unremarkable. No pancreatic ductal dilatation or surrounding inflammatory changes. Spleen: Postsurgical changes from prior  splenectomy with multiple surgical clips in left upper quadrant. Adrenals/Urinary Tract: Normal adrenal glands. No suspicious adrenal lesions. Mild bilateral symmetric perinephric stranding, a nonspecific finding though may correlate with either age or decreased renal function. Subcentimeter hypoattenuating focus seen in the cortex of the right inferior pole too small to fully characterize on CT imaging but statistically likely benign. Urinary bladder is distended but otherwise unremarkable. Stomach/Bowel: Distal esophagus, stomach and duodenal sweep  are unremarkable. No bowel wall thickening or dilatation. No evidence of obstruction. A normal appendix is visualized. Small surgical clip is noted near the appendiceal tip. Vascular/Lymphatic: Focal fusiform infrarenal abdominal aortic ectasia measuring up to 2.5 cm. Extensive atheromatous plaque throughout the aorta and branch vessels of the abdomen. Reproductive: The prostate and seminal vesicles are unremarkable. Other: No abdominopelvic free fluid or free gas. No bowel containing hernias. Musculoskeletal: Multilevel degenerative changes. Additional remote compression deformity versus large Schmorl's node formations are noted at the L1 and L3 levels. No suspicious osseous lesions. Suspect road remote posttraumatic deformity of the left pubic root. IMPRESSION: 1. Spiculated partially cavitating 2.3 cm nodule in the left upper lobe, highly concerning for primary bronchogenic carcinoma. Consider further evaluation with tissue sampling and/or PET-CT as clinically warranted. 2. Few prominent mediastinal and left hilar lymph nodes, nonspecific, but metastatic disease cannot be excluded. Could be further evaluated with PET-CT. 3. No evidence of metastatic disease within the abdomen or pelvis. 4. 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 Ectatic abdominal aorta at risk for aneurysm development. Recommend followup by ultrasound in 5 years.  This recommendation follows ACR consensus guidelines: White Paper of the ACR Incidental Findings Committee II on Vascular Findings. J Am Coll Radiol 2013; 10:789-794. Aortic aneurysm NOS (ICD10-I71.9) 5. Few prominent paraesophageal venous collaterals. Correlate with history of variceal disease. 6. Post splenectomy. 7. Mild bilateral symmetric perinephric stranding, a nonspecific finding though may correlate with either age or decreased renal function. 8.  Emphysema (ICD10-J43.9). 9.  Aortic Atherosclerosis (ICD10-I70.0). Electronically Signed   By: Lovena Le M.D.   On: 07/20/2019 19:19   Ct Cervical Spine Wo Contrast  Result Date: 07/19/2019 CLINICAL DATA:  Patient found beside bed with left-sided weakness. EXAM: CT CERVICAL SPINE WITHOUT CONTRAST TECHNIQUE: Multidetector CT imaging of the cervical spine was performed without intravenous contrast. Multiplanar CT image reconstructions were also generated. COMPARISON:  MRI 01/07/2019 FINDINGS: Alignment: Normal. Skull base and vertebrae: Anterior fusion hardware intact from C3-C7. Vertebral body heights are normal. There is mild spondylosis throughout the cervical spine. There is moderate uncovertebral joint spurring. Atlantoaxial articulation is normal. There is significant multilevel bilateral neural foraminal narrowing most prominent from the C4-5 to the C6-7 levels. No acute fracture or traumatic subluxation. Soft tissues and spinal canal: No prevertebral fluid or swelling. No visible canal hematoma. Disc levels:  Interbody fusion from C3-C7. Upper chest: No acute findings. Other: None. IMPRESSION: No acute findings. Mild spondylosis throughout the cervical spine. Significant bilateral neural foraminal narrowing at multiple levels as described. Anterior fusion hardware intact from C3-C7. Electronically Signed   By: Marin Olp M.D.   On: 07/19/2019 10:29   Mr Jeri Cos ER Contrast  Result Date: 07/30/2019 CLINICAL DATA:  Brain tumor. Spiculated lung  mass. Primary CNS tumor versus metastatic disease. EXAM: MRI HEAD WITHOUT AND WITH CONTRAST TECHNIQUE: Multiplanar, multiecho pulse sequences of the brain and surrounding structures were obtained without and with intravenous contrast. CONTRAST:  57mL GADAVIST GADOBUTROL 1 MMOL/ML IV SOLN COMPARISON:  MRI brain 07/19/2019 FINDINGS: Brain: The a large focus of abnormal signal in the right posterior frontal and parietal lobe is again noted. A right parietal focus demonstrating restricted diffusion on the prior study is again noted to have abnormal diffusion characteristics. There is also more avid enhancement of this lesion on today's study. The area of enhancement measures 15 x 9 mm. A smaller a rounded area of enhancement is present posterior right frontal lobe measuring 6 mm.  There is a focus of enhancement the posterior corona radiata measuring 4 mm on image 124 of series 11 no other discrete lesions are present. Diffuse edema and mass effect are again noted midline shift 7 mm is not significantly changed. There is partial effacement of the right lateral ventricle. Sulci are effaced on the right. Tract agger fee was performed this demonstrates mass effect displacing cortical spinal tracts medially without significant disruption of major white matter tracts. No acute hemorrhage is present. No significant ischemic infarct is present. Vascular: Flow is present in the major intracranial arteries. Skull and upper cervical spine: Cervical fusion is noted. Craniocervical junction is normal. Midline structures are otherwise unremarkable. Sinuses/Orbits: The paranasal sinuses and mastoid air cells are clear. The globes and orbits are within normal limits. IMPRESSION: 1. Two separate cortical foci of enhancement demonstrated. There is restricted diffusion associated with the more posterior area, within the right parietal lobe. Findings are characteristic of either high-grade primary neoplasm or metastatic disease associated  with a small cell lung cancer. 2. Similar appearance of extensive cerebral edema and mass effect. 3. Tract tire fee was performed demonstrate displacement without disruption of major white matter tracts. Electronically Signed   By: San Morelle M.D.   On: 07/30/2019 11:08   Mr Jeri Cos YD Contrast  Result Date: 07/19/2019 CLINICAL DATA:  68 year old male with presentation of shaking and weakness. Masslike abnormality on noncontrast head CT earlier today. EXAM: MRI HEAD WITHOUT AND WITH CONTRAST TECHNIQUE: Multiplanar, multiecho pulse sequences of the brain and surrounding structures were obtained without and with intravenous contrast. CONTRAST:  59mL GADAVIST GADOBUTROL 1 MMOL/ML IV SOLN COMPARISON:  Head CT at 0947 hours. FINDINGS: Brain: Within the right hemisphere there is a large masslike area of heterogeneously increased T2 and FLAIR hyperintensity with indistinct margins (series 9, image 17), T1 hypointensity. The right frontal, parietal, posterior temporal and anterior occipital lobes are affected. Following contrast only trace petechial type abnormal postcontrast enhancement seen in the right parietal region on series 18, image 43 and series 19, image 9. The abnormality encompasses 92 x 63 by 64 millimeters (AP by transverse by CC), and does appear to track into the splenium of the corpus callosum on series 9, image 16. The remaining corpus callosum appears spared. On DWI there is a heterogeneous region of partially restricted diffusion in the right parietal lobe which corresponds to the area of petechial enhancement (series 5 images 89-91). There may be trace hemosiderin in that region. Elsewhere diffusion is facilitated. Associated intracranial mass effect with 8 millimeters of leftward midline shift and partial effacement of the right lateral ventricle. Basilar cisterns remain patent. No signal abnormality or similar findings in the left hemisphere hemisphere, brainstem or cerebellum. No dural  thickening. No other abnormal intracranial enhancement. No superimposed acute infarct. No restricted diffusion to suggest acute infarction. No ventriculomegaly, extra-axial collection or acute intracranial hemorrhage. Cervicomedullary junction and pituitary are within normal limits. Vascular: Major intracranial vascular flow voids are preserved. The major dural venous sinuses are enhancing and appear to be patent. Skull and upper cervical spine: Partially visible cervical ACDF. Visualized bone marrow signal is within normal limits. Sinuses/Orbits: Negative orbits. Paranasal Visualized paranasal sinuses and mastoids are stable and well pneumatized. Other: Visible internal auditory structures appear normal. Scalp and face soft tissues appear negative. IMPRESSION: 1. Large up to 9 cm infiltrative, predominantly T2 hyperintense mass in the right hemisphere with multilobar involvement. Only trace petechial enhancement in a right parietal region where hypercellularity is suspected on  diffusion imaging. Some involvement of the splenium is suspected, but no extension across midline. 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 High-grade Astrocytoma and Glioblastoma are possible. Gliomatosis cerebri is possible. CNS lymphoma is less likely. 2. Stable intracranial mass effect from earlier today. Electronically Signed   By: Genevie Ann M.D.   On: 07/19/2019 19:21   Ct Abdomen Pelvis W Contrast  Result Date: 07/20/2019 CLINICAL DATA:  CNS neoplasm, evaluate for primary malignancy EXAM: CT CHEST, ABDOMEN, AND PELVIS WITH CONTRAST TECHNIQUE: Multidetector CT imaging of the chest, abdomen and pelvis was performed following the standard protocol during bolus administration of intravenous contrast. CONTRAST:  174mL OMNIPAQUE IOHEXOL 300 MG/ML  SOLN COMPARISON:  CT abdomen pelvis 09/12/2016, MR thoracic spine 01/07/2019 FINDINGS: CT CHEST FINDINGS Cardiovascular: Normal heart size. No  pericardial effusion. Atherosclerotic calcification of the coronary arteries. Atherosclerotic plaque within the normal caliber aorta. Calcifications present in the proximal great vessels as well. Central pulmonary arteries are normal caliber. Mediastinum/Nodes: Few prominent lymph nodes include a in 8 mm precarinal lymph node (1/29) and a 9 mm left hilar lymph node (1/37). No axillary adenopathy. Thyroid gland and thoracic inlet are unremarkable. Trachea is free of abnormality. Small amount of venous collateralization noted about the distal thoracic esophagus. Esophagus is otherwise unremarkable. Lungs/Pleura: There is a spiculated partially cavitating nodule in the left upper lobe measuring 2.3 x 1.9 x 1.6 cm in size. Extension of this lesion is seen to the adjacent subpleural surface and towards the major fissure. Bandlike areas of scarring and/or atelectasis are present in the left lower lobe and right middle lobe. There is mild apical predominant centrilobular emphysematous change. No other suspicious nodules or masses. Musculoskeletal: Prior anterior cervical spinal fusion. Remote compression deformity versus Schmorl's node formation noted at the T10 vertebral body. Nose acute fracture, vertebral body height loss or suspicious osseous lesion. No concerning chest wall soft tissue abnormality. CT ABDOMEN PELVIS FINDINGS Hepatobiliary: No visible or concerning hepatic lesions. Gallbladder is largely decompressed at the time of exam. No frank gallbladder wall thickening, pericholecystic fluid or inflammation or distal calcified gallstones either within the gallbladder lumen or biliary tree. Pancreas: Unremarkable. No pancreatic ductal dilatation or surrounding inflammatory changes. Spleen: Postsurgical changes from prior splenectomy with multiple surgical clips in left upper quadrant. Adrenals/Urinary Tract: Normal adrenal glands. No suspicious adrenal lesions. Mild bilateral symmetric perinephric stranding, a  nonspecific finding though may correlate with either age or decreased renal function. Subcentimeter hypoattenuating focus seen in the cortex of the right inferior pole too small to fully characterize on CT imaging but statistically likely benign. Urinary bladder is distended but otherwise unremarkable. Stomach/Bowel: Distal esophagus, stomach and duodenal sweep are unremarkable. No bowel wall thickening or dilatation. No evidence of obstruction. A normal appendix is visualized. Small surgical clip is noted near the appendiceal tip. Vascular/Lymphatic: Focal fusiform infrarenal abdominal aortic ectasia measuring up to 2.5 cm. Extensive atheromatous plaque throughout the aorta and branch vessels of the abdomen. Reproductive: The prostate and seminal vesicles are unremarkable. Other: No abdominopelvic free fluid or free gas. No bowel containing hernias. Musculoskeletal: Multilevel degenerative changes. Additional remote compression deformity versus large Schmorl's node formations are noted at the L1 and L3 levels. No suspicious osseous lesions. Suspect road remote posttraumatic deformity of the left pubic root. IMPRESSION: 1. Spiculated partially cavitating 2.3 cm nodule in the left upper lobe, highly concerning for primary bronchogenic carcinoma. Consider further evaluation with tissue sampling and/or PET-CT as clinically warranted. 2. Few prominent  mediastinal and left hilar lymph nodes, nonspecific, but metastatic disease cannot be excluded. Could be further evaluated with PET-CT. 3. No evidence of metastatic disease within the abdomen or pelvis. 4. 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 Ectatic abdominal aorta at risk for aneurysm development. Recommend followup by ultrasound in 5 years. This recommendation follows ACR consensus guidelines: White Paper of the ACR Incidental Findings Committee II on Vascular Findings. J Am Coll Radiol 2013; 10:789-794. Aortic aneurysm NOS  (ICD10-I71.9) 5. Few prominent paraesophageal venous collaterals. Correlate with history of variceal disease. 6. Post splenectomy. 7. Mild bilateral symmetric perinephric stranding, a nonspecific finding though may correlate with either age or decreased renal function. 8.  Emphysema (ICD10-J43.9). 9.  Aortic Atherosclerosis (ICD10-I70.0). Electronically Signed   By: Lovena Le M.D.   On: 07/20/2019 19:19   Dg Swallowing Func-speech Pathology  Result Date: 07/21/2019 Objective Swallowing Evaluation: Type of Study: MBS-Modified Barium Swallow Study  Patient Details Name: Gabriel Atkins MRN: [id number] Date of Birth: 1951-04-18 Today's Date: 07/21/2019 Time: SLP Start Time (ACUTE ONLY): 0932 -SLP Stop Time (ACUTE ONLY): 8563 SLP Time Calculation (min) (ACUTE ONLY): 15 min Past Medical History: Past Medical History: Diagnosis Date  Arthritis   GERD (gastroesophageal reflux disease)   History of hiatal hernia   Hypertension  Past Surgical History: Past Surgical History: Procedure Laterality Date  ANTERIOR CERVICAL DECOMPRESSION/DISCECTOMY FUSION 4 LEVELS N/A 05/21/2019  Procedure: ANTERIOR CERVICAL DECOMPRESSION/DISCECTOMY FUSION - CERVICAL THREE-FOUR, CERVICAL FOUR-FIVE, CERVICAL FIVE-SIX, CERVICAL SIX-SEVEN;  Surgeon: Kary Kos, MD;  Location: [facility];  Service: Neurosurgery;  Laterality: N/A;  CYST EXCISION    Left Groin  SPLENECTOMY, TOTAL   HPI: Gabriel Atkins is a 68 y.o. male with medical history significant for arthritis, GERD, hypertension, and recent anterior cervical decompression/discectomy-fusion at 4 levels from C3-C7 on 05/21/2019, who was at his usual baseline when he awoke this morning to get coffee at 8 AM.  His wife then heard a fall associated with some left-sided weakness and aphasia.  He then does not recall what happened, but according to the wife, he was noted to have some generalized shaking as well as some jerking of his left arm.  The next thing the patient recalls is EMS arriving at his  home.  Apparently, his wife had called the neurosurgeon on call, Dr. Vertell Limber, who had recommended that the patient come to the ED.  The patient has had improvement in his aphasia and is able to give me a history at this time.  He denies any headache, visual changes.  He states that his weakness is intermittent, but improving.  He is right-handed.  MRI of the head was completed and showing a large up to 9 cm infiltrative, predominantly T2 hyperintense mass in the right hemisphere with multilobar involvement. Only trace  Subjective: alert, pleasant, conversant Assessment / Plan / Recommendation CHL IP CLINICAL IMPRESSIONS 07/21/2019 Clinical Impression Pt presents with functional oropharyngeal abilities that are c/b timely swallow initiation, good epiglottic deflection (despite hardware) and complete airway protection when consuming thin liquids, barium tablet whole and solids. Recommend continuing current diet of regular with thin liquids, medicine whole.  SLP Visit Diagnosis Dysphagia, unspecified (R13.10) Attention and concentration deficit following -- Frontal lobe and executive function deficit following -- Impact on safety and function No limitations   CHL IP TREATMENT RECOMMENDATION 07/21/2019 Treatment Recommendations No treatment recommended at this time   No flowsheet data found. CHL IP DIET RECOMMENDATION 07/21/2019 SLP Diet Recommendations Regular solids;Thin  liquid Liquid Administration via Spoon;Cup;Straw Medication Administration Whole meds with liquid Compensations Minimize environmental distractions;Slow rate;Small sips/bites Postural Changes Seated upright at 90 degrees   CHL IP OTHER RECOMMENDATIONS 07/21/2019 Recommended Consults -- Oral Care Recommendations Oral care BID Other Recommendations --   CHL IP FOLLOW UP RECOMMENDATIONS 07/20/2019 Follow up Recommendations Other (comment)   No flowsheet data found.     CHL IP ORAL PHASE 07/21/2019 Oral Phase WFL Oral - Pudding Teaspoon -- Oral - Pudding Cup --  Oral - Honey Teaspoon -- Oral - Honey Cup -- Oral - Nectar Teaspoon -- Oral - Nectar Cup -- Oral - Nectar Straw -- Oral - Thin Teaspoon -- Oral - Thin Cup -- Oral - Thin Straw -- Oral - Puree -- Oral - Mech Soft -- Oral - Regular -- Oral - Multi-Consistency -- Oral - Pill -- Oral Phase - Comment --  CHL IP PHARYNGEAL PHASE 07/21/2019 Pharyngeal Phase WFL Pharyngeal- Pudding Teaspoon -- Pharyngeal -- Pharyngeal- Pudding Cup -- Pharyngeal -- Pharyngeal- Honey Teaspoon -- Pharyngeal -- Pharyngeal- Honey Cup -- Pharyngeal -- Pharyngeal- Nectar Teaspoon -- Pharyngeal -- Pharyngeal- Nectar Cup -- Pharyngeal -- Pharyngeal- Nectar Straw -- Pharyngeal -- Pharyngeal- Thin Teaspoon -- Pharyngeal -- Pharyngeal- Thin Cup -- Pharyngeal -- Pharyngeal- Thin Straw -- Pharyngeal -- Pharyngeal- Puree -- Pharyngeal -- Pharyngeal- Mechanical Soft -- Pharyngeal -- Pharyngeal- Regular -- Pharyngeal -- Pharyngeal- Multi-consistency -- Pharyngeal -- Pharyngeal- Pill -- Pharyngeal -- Pharyngeal Comment --  CHL IP CERVICAL ESOPHAGEAL PHASE 07/21/2019 Cervical Esophageal Phase WFL Pudding Teaspoon -- Pudding Cup -- Honey Teaspoon -- Honey Cup -- Nectar Teaspoon -- Nectar Cup -- Nectar Straw -- Thin Teaspoon -- Thin Cup -- Thin Straw -- Puree -- Mechanical Soft -- Regular -- Multi-consistency -- Pill -- Cervical Esophageal Comment -- Happi Overton 07/21/2019, 8:48 AM              Ct Head Code Stroke Wo Contrast  Result Date: 07/19/2019 CLINICAL DATA:  Code stroke. Episode of arm shaking and weakness today. No symptoms prior to today. EXAM: CT HEAD WITHOUT CONTRAST TECHNIQUE: Contiguous axial images were obtained from the base of the skull through the vertex without intravenous contrast. COMPARISON:  None. FINDINGS: Brain: Masslike abnormality with high-density/preserved cortex in the right frontal and parietal region with midline shift measuring 8 mm. Although subacute infarct with petechial hemorrhage is considered there were no symptoms  yesterday such that mass with vasogenic edema and seizure is most likely. No hematoma or hydrocephalus. Vascular: No hyperdense vessel. Skull: Negative Sinuses/Orbits: Negative Other: These results were called by telephone at the time of interpretation on 07/19/2019 at 10:13 am to provider [facility] , who verbally acknowledged these results. ASPECTS [facility] Stroke Program Early CT Score) Not scored in this setting IMPRESSION: Masslike abnormality centered along the high right frontal parietal lobe with vasogenic edema and 8 mm midline shift. Recommend brain MRI with contrast. Electronically Signed   By: Monte Fantasia M.D.   On: 07/19/2019 10:14     ASSESSMENT & PLAN:  Gabriel Atkins is a 68 y.o. male with:  1.  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    stereotactic biopsy of his brain - final results pending.  -We will await final biopsy result before making further recommendations -Will likely need a neuro oncology consult as well as radiation oncology consult once pathology obtained -patients case is up for discussion at the upcoming head and neck tumor board.  2.  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 -Has an approximate 80-pack-year smoking history -Lung mass suspicious for primary lung cancer -PET/CT for staging -rad onc consultation if FDG avid Stage I disease lung mass SRS with or without biopsy  3.  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 -He has had recurrent seizures with left UE shaking and weakness -On Keppra - increased dose of Keppra from 500mg  to 750mg  po BID -increased dexamethasone to 4mg  po BID -further mx by neuro Onc  FOLLOW UP PET/CT in 5 days RTC with Dr Irene Limbo in 10 days  All of the patients questions were answered with apparent satisfaction. The patient knows to call the clinic with any problems, questions or concerns.  I spent 40 minutes counseling the patient face to face. The total time spent in the appointment was 60 minutes  and more than 50% was on counseling and direct patient cares.    Sullivan Lone MD MS AAHIVMS [facility] Hematology/Oncology Physician [facility]  (Office):       [phone] (Work cell):  [phone] (Fax):           [phone]  08/07/2019 11:07 PM  I, Jacqualyn Posey, am acting as a Education administrator for Dr. Sullivan Lone.   .I have reviewed the above documentation for accuracy and completeness, and I agree with the above. Brunetta Genera MD

## 2019-08-08 ENCOUNTER — Inpatient Hospital Stay: Payer: Medicare Other

## 2019-08-08 ENCOUNTER — Inpatient Hospital Stay: Payer: Medicare Other | Attending: Hematology | Admitting: Hematology

## 2019-08-08 ENCOUNTER — Other Ambulatory Visit: Payer: Self-pay

## 2019-08-08 VITALS — BP 114/71 | HR 86 | Temp 98.9°F | Resp 18 | Ht 69.0 in | Wt 170.7 lb

## 2019-08-08 DIAGNOSIS — C3490 Malignant neoplasm of unspecified part of unspecified bronchus or lung: Secondary | ICD-10-CM | POA: Diagnosis not present

## 2019-08-08 DIAGNOSIS — R918 Other nonspecific abnormal finding of lung field: Secondary | ICD-10-CM | POA: Insufficient documentation

## 2019-08-08 DIAGNOSIS — R569 Unspecified convulsions: Secondary | ICD-10-CM | POA: Insufficient documentation

## 2019-08-08 DIAGNOSIS — C719 Malignant neoplasm of brain, unspecified: Secondary | ICD-10-CM | POA: Diagnosis not present

## 2019-08-08 DIAGNOSIS — C713 Malignant neoplasm of parietal lobe: Secondary | ICD-10-CM | POA: Diagnosis not present

## 2019-08-08 DIAGNOSIS — C3412 Malignant neoplasm of upper lobe, left bronchus or lung: Secondary | ICD-10-CM

## 2019-08-08 DIAGNOSIS — R269 Unspecified abnormalities of gait and mobility: Secondary | ICD-10-CM | POA: Insufficient documentation

## 2019-08-08 DIAGNOSIS — R911 Solitary pulmonary nodule: Secondary | ICD-10-CM | POA: Insufficient documentation

## 2019-08-08 DIAGNOSIS — D496 Neoplasm of unspecified behavior of brain: Secondary | ICD-10-CM | POA: Diagnosis not present

## 2019-08-08 LAB — CMP (CANCER CENTER ONLY)
ALT: 64 U/L — ABNORMAL HIGH (ref 0–44)
AST: 27 U/L (ref 15–41)
Albumin: 3.5 g/dL (ref 3.5–5.0)
Alkaline Phosphatase: 104 U/L (ref 38–126)
Anion gap: 9 (ref 5–15)
BUN: 21 mg/dL (ref 8–23)
CO2: 23 mmol/L (ref 22–32)
Calcium: 9 mg/dL (ref 8.9–10.3)
Chloride: 104 mmol/L (ref 98–111)
Creatinine: 0.97 mg/dL (ref 0.61–1.24)
GFR, Est AFR Am: >60 mL/min (ref >60)
GFR, Estimated: >60 mL/min (ref >60)
Glucose, Bld: 155 mg/dL — ABNORMAL HIGH (ref 70–99)
Potassium: 4.6 mmol/L (ref 3.5–5.1)
Sodium: 136 mmol/L (ref 135–145)
Total Bilirubin: 1.4 mg/dL — ABNORMAL HIGH (ref 0.3–1.2)
Total Protein: 6.6 g/dL (ref 6.5–8.1)

## 2019-08-08 LAB — CBC WITH DIFFERENTIAL (CANCER CENTER ONLY)
Abs Immature Granulocytes: 0.07 10*3/uL (ref 0.00–0.07)
Basophils Absolute: 0 10*3/uL (ref 0.0–0.1)
Basophils Relative: 0 %
Eosinophils Absolute: 0 10*3/uL (ref 0.0–0.5)
Eosinophils Relative: 0 %
HCT: 47.3 % (ref 39.0–52.0)
Hemoglobin: 15.6 g/dL (ref 13.0–17.0)
Immature Granulocytes: 1 %
Lymphocytes Relative: 5 %
Lymphs Abs: 0.7 10*3/uL (ref 0.7–4.0)
MCH: 31.7 pg (ref 26.0–34.0)
MCHC: 33 g/dL (ref 30.0–36.0)
MCV: 96.1 fL (ref 80.0–100.0)
Monocytes Absolute: 0.8 10*3/uL (ref 0.1–1.0)
Monocytes Relative: 6 %
Neutro Abs: 12.4 10*3/uL — ABNORMAL HIGH (ref 1.7–7.7)
Neutrophils Relative %: 88 %
Platelet Count: 180 10*3/uL (ref 150–400)
RBC: 4.92 MIL/uL (ref 4.22–5.81)
RDW: 14.2 % (ref 11.5–15.5)
WBC Count: 14 10*3/uL — ABNORMAL HIGH (ref 4.0–10.5)
nRBC: 0 % (ref 0.0–0.2)

## 2019-08-08 MED ORDER — DEXAMETHASONE 4 MG PO TABS: 4.0000 mg | ORAL_TABLET | Freq: Two times a day (BID) | ORAL | 0 refills | Status: AC

## 2019-08-08 MED ORDER — LEVETIRACETAM 750 MG PO TABS: 750.0000 mg | ORAL_TABLET | Freq: Two times a day (BID) | ORAL | 1 refills | Status: AC

## 2019-08-08 MED ORDER — LORAZEPAM 2 MG/ML PO CONC: 1.0000 mg | Freq: Four times a day (QID) | ORAL | 0 refills | Status: AC | PRN

## 2019-08-11 ENCOUNTER — Telehealth: Payer: Self-pay | Admitting: Hematology

## 2019-08-11 ENCOUNTER — Telehealth: Payer: Self-pay | Admitting: Radiation Therapy

## 2019-08-11 NOTE — Telephone Encounter (Signed)
Scheduled appt per 10/2 los.  Spoke with patient spouse aand she is aware of the appt date and time

## 2019-08-11 NOTE — Telephone Encounter (Signed)
Spoke with Mr. College wife about his upcoming appointments with Dr. Mickeal Skinner and Dr. Lisbeth Renshaw. She has these appointments written down and was thankful for the call.  Mont Dutton R.T.(R)(T) Radiation Special Procedures Navigator

## 2019-08-11 NOTE — Telephone Encounter (Signed)
Appt made 10/20. Appt made, location provided. Nothing further needed at this time.

## 2019-08-14 ENCOUNTER — Other Ambulatory Visit (HOSPITAL_COMMUNITY): Payer: Self-pay | Admitting: Neurosurgery

## 2019-08-14 ENCOUNTER — Other Ambulatory Visit: Payer: Self-pay | Admitting: Neurosurgery

## 2019-08-14 DIAGNOSIS — M25551 Pain in right hip: Secondary | ICD-10-CM

## 2019-08-15 ENCOUNTER — Telehealth: Payer: Self-pay | Admitting: *Deleted

## 2019-08-15 DIAGNOSIS — C719 Malignant neoplasm of brain, unspecified: Secondary | ICD-10-CM

## 2019-08-15 NOTE — Telephone Encounter (Signed)
Received vm message from pt's wife stating that she is needing extra assistance with her husband as far as transferring him from bed to chair, bed to commode etc.  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 His wife has RA and has difficulty managing  Some of his care needs at home. She states he has fallen a couple of times and she has fallen trying to get him up. Advised that we could at least start with [facility] Referral for PT, Skilled nurse to evaluate medical needs. She is agreeable to this.  Home Health referral made for skilled nurse and PT. Spoke with Butch Penny with [facility] to make her aware of referral.  Received call back from her and she stated she thought [facility] could see this patient next week.

## 2019-08-18 ENCOUNTER — Ambulatory Visit: Payer: Medicare Other | Admitting: Internal Medicine

## 2019-08-18 ENCOUNTER — Telehealth: Payer: Self-pay | Admitting: *Deleted

## 2019-08-18 NOTE — Telephone Encounter (Signed)
Stacy, PT @ [facility] called reporting that pt was seen today for home PT.  Marzetta Board would like to have order for : PT  -  3 times per week  For  2  Weeks., then PT  -  2 times per week  For  2  Weeks. Stacy's   Phone    [phone].

## 2019-08-19 ENCOUNTER — Ambulatory Visit: Payer: Medicare Other

## 2019-08-19 ENCOUNTER — Other Ambulatory Visit: Payer: Self-pay

## 2019-08-19 ENCOUNTER — Ambulatory Visit: Payer: Medicare Other | Admitting: Radiation Oncology

## 2019-08-19 ENCOUNTER — Encounter (HOSPITAL_COMMUNITY): Payer: Self-pay | Admitting: Internal Medicine

## 2019-08-19 ENCOUNTER — Telehealth: Payer: Self-pay | Admitting: *Deleted

## 2019-08-19 ENCOUNTER — Inpatient Hospital Stay (HOSPITAL_BASED_OUTPATIENT_CLINIC_OR_DEPARTMENT_OTHER): Payer: Medicare Other | Admitting: Internal Medicine

## 2019-08-19 ENCOUNTER — Ambulatory Visit
Admission: RE | Admit: 2019-08-19 | Discharge: 2019-08-19 | Disposition: A | Discharge status: Home / Self Care | Payer: Medicare Other | Source: Ambulatory Visit | Attending: Radiation Oncology | Admitting: Radiation Oncology

## 2019-08-19 VITALS — BP 105/74 | HR 57 | Temp 98.7°F | Resp 18 | Ht 69.0 in | Wt 157.2 lb

## 2019-08-19 DIAGNOSIS — R569 Unspecified convulsions: Secondary | ICD-10-CM | POA: Diagnosis not present

## 2019-08-19 DIAGNOSIS — R269 Unspecified abnormalities of gait and mobility: Secondary | ICD-10-CM | POA: Diagnosis not present

## 2019-08-19 DIAGNOSIS — R911 Solitary pulmonary nodule: Secondary | ICD-10-CM

## 2019-08-19 DIAGNOSIS — C713 Malignant neoplasm of parietal lobe: Secondary | ICD-10-CM

## 2019-08-19 DIAGNOSIS — C719 Malignant neoplasm of brain, unspecified: Secondary | ICD-10-CM

## 2019-08-19 DIAGNOSIS — Z7189 Other specified counseling: Secondary | ICD-10-CM | POA: Insufficient documentation

## 2019-08-19 NOTE — Progress Notes (Signed)
[facility] [place], [place] [address] [phone]   New Patient Evaluation  Date of Service: 08/19/19 Patient Name: Gabriel Atkins Patient MRN: [id number] Patient DOB: 08/02/1951 Provider: Ventura Sellers, MD  Identifying Statement:  Gabriel Atkins is a 68 y.o. male with right parietal glioblastoma who presents for initial consultation and evaluation.    Referring Provider: Sandi Mealy, MD [place],  [place] [address]  Oncologic History: Oncology History  Glioblastoma, IDH-wildtype [facility])  07/30/2019 Surgery   Biopsy by Dr. Saintclair Halsted; demonstrates Glioblastoma IDH-wt     Biomarkers:  MGMT Unknown.  IDH 1/2 Wild type.  EGFR Unknown  TERT Unknown   History of Present Illness: The patient's records from the referring physician were obtained and reviewed and the patient interviewed to confirm this HPI.  Gabriel Atkins presented to medical attention 3 weeks ago with onset of first ever seizure, describe as "left sided shaking".  This prompted an inpatient evaluation and CNS imaging, which demonstrated large infiltrative right parietal tumor.  In addition, a small lung mass was uncovered.  He underwent biopsy with Dr. Saintclair Halsted without complication.  He was discharged to home, walking and talking.  No further seizure since discharge.  Unfortunately, he has experienced considerable overall decline since his surgery.  This is characterized by progressive left sided weakness, severe gait impairment.  He is now requiring a wheelchair even to get around the home.  He is sleeping often and has exhibited poor memory and understanding of his condition.  Dr. Saintclair Halsted last week increased his decadron to 68m daily, but his has not led to any improvement.    Medications: Current Outpatient Medications on File Prior to Visit  Medication Sig Dispense Refill   acetaminophen (TYLENOL) 500 MG tablet Take 1,000 mg by mouth every 6 (six)  hours as needed for moderate pain or headache.     atorvastatin (LIPITOR) 40 MG tablet Take 40 mg by mouth at bedtime.     benazepril (LOTENSIN) 20 MG tablet Take 20 mg by mouth daily.     dexamethasone (DECADRON) 4 MG tablet Take 1 tablet (4 mg total) by mouth 2 (two) times daily with breakfast and lunch. 30 tablet 0   diphenhydrAMINE (BENADRYL) 25 MG tablet Take 25 mg by mouth at bedtime.     famotidine (PEPCID) 20 MG tablet Take 20 mg by mouth at bedtime.     levETIRAcetam (KEPPRA) 750 MG tablet Take 1 tablet (750 mg total) by mouth 2 (two) times daily. 60 tablet 1   Lidocaine (BLUE-EMU PAIN RELIEF DRY EX) Apply 1 application topically daily as needed (pain).     loratadine (CLARITIN) 10 MG tablet Take 10 mg by mouth daily as needed for allergies.     LORazepam (LORAZEPAM INTENSOL) 2 MG/ML concentrated solution Place 0.5 mLs (1 mg total) under the tongue every 6 (six) hours as needed for seizure. 30 mL 0   metoprolol tartrate (LOPRESSOR) 25 MG tablet Take 25 mg by mouth 2 (two) times daily.     miconazole (MICOTIN) 2 % cream Apply 1 application topically daily as needed.      mometasone (NASONEX) 50 MCG/ACT nasal spray Place 1-2 sprays into the nose daily as needed.     Multiple Vitamins-Minerals (CENTRUM SILVER ADULT 50+) TABS Take 1 tablet by mouth daily.     promethazine (PHENERGAN) 12.5 MG tablet Take 1 tablet by mouth 3 (three) times daily as  needed.     No current facility-administered medications on file prior to visit.     Allergies:  Allergies  Allergen Reactions   Ciprofloxacin     Headaches, dizziness, cold sweats     Codeine Nausea And Vomiting    Headaches, dizziness, cold sweats     Morphine And Related     "doesn't work"   Latex Rash   Oxycodone Nausea Only and Other (See Comments)    Hallucinations   Pentazocine Nausea And Vomiting   Past Medical History:  Past Medical History:  Diagnosis Date   Arthritis    GERD (gastroesophageal reflux  disease)    History of hiatal hernia    Hypertension    Lung nodule 07/2019   Past Surgical History:  Past Surgical History:  Procedure Laterality Date   ANTERIOR CERVICAL DECOMPRESSION/DISCECTOMY FUSION 4 LEVELS N/A 05/21/2019   Procedure: ANTERIOR CERVICAL DECOMPRESSION/DISCECTOMY FUSION - CERVICAL THREE-FOUR, CERVICAL FOUR-FIVE, CERVICAL FIVE-SIX, CERVICAL SIX-SEVEN;  Surgeon: Kary Kos, MD;  Location: [place];  Service: Neurosurgery;  Laterality: N/A;   APPLICATION OF CRANIAL NAVIGATION Right 07/30/2019   Procedure: APPLICATION OF CRANIAL NAVIGATION;  Surgeon: Kary Kos, MD;  Location: [place];  Service: Neurosurgery;  Laterality: Right;   BRAIN BIOPSY Right 07/30/2019   Procedure: Right Parietal craniotomy with brainlab;  Surgeon: Kary Kos, MD;  Location: [place];  Service: Neurosurgery;  Laterality: Right;   CYST EXCISION     Left Groin   SPLENECTOMY, TOTAL     Social History:  Social History   Socioeconomic History   Marital status: Married    Spouse name: Not on file   Number of children: Not on file   Years of education: Not on file   Highest education level: Not on file  Occupational History   Not on file  Social Needs   Financial resource strain: Patient refused   Food insecurity    Worry: Never true    Inability: Never true   Transportation needs    Medical: No    Non-medical: No  Tobacco Use   Smoking status: Former Smoker    Packs/day: 2.00   Smokeless tobacco: Never Used  Substance and Sexual Activity   Alcohol use: Not Currently   Drug use: Never   Sexual activity: Not on file  Lifestyle   Physical activity    Days per week: Patient refused    Minutes per session: Patient refused   Stress: Not on file  Relationships   Social connections    Talks on phone: Patient refused    Gets together: Patient refused    Attends religious service: Patient refused    Active member of club or organization: Patient refused    Attends meetings  of clubs or organizations: Patient refused    Relationship status: Patient refused   Intimate partner violence    Fear of current or ex partner: Patient refused    Emotionally abused: Patient refused    Physically abused: Patient refused    Forced sexual activity: Patient refused  Other Topics Concern   Not on file  Social History Narrative   Not on file   Family History: No family history on file.  Review of Systems: Constitutional: Denies fevers, chills or abnormal weight loss Eyes: Denies blurriness of vision Ears, nose, mouth, throat, and face: Denies mucositis or sore throat Respiratory: Denies cough, dyspnea or wheezes Cardiovascular: Denies palpitation, chest discomfort or lower extremity swelling Gastrointestinal:  Denies nausea, constipation, diarrhea GU: Denies dysuria or incontinence  Skin: Denies abnormal skin rashes Neurological: Per HPI Musculoskeletal: Denies joint pain, back or neck discomfort. No decrease in ROM Behavioral/Psych: Denies anxiety, disturbance in thought content, and mood instability  Physical Exam: Vitals:   08/19/19 0931  BP: 105/74  Pulse: (!) 57  Resp: 18  Temp: 98.7 F (37.1 C)  SpO2: 97%   KPS: 60. General: Alert, cooperative, pleasant, in no acute distress Head: Craniotomy scar noted, dry and intact. EENT: No conjunctival injection or scleral icterus. Oral mucosa moist Lungs: Resp effort normal Cardiac: Regular rate and rhythm Abdomen: Soft, non-distended abdomen Skin: No rashes cyanosis or petechiae. Extremities: No clubbing or edema  Neurologic Exam: Mental Status: Awake, not alert, attentive to examiner with direct verbal stimulation only. Otherwise oriented to self and environment. Language is fluent with grossly intact comprehension.  Dense visual and motor neglect of left environment noted. Cranial Nerves: Visual acuity is grossly normal. Visual fields are full. Extra-ocular movements intact. No ptosis. Face is symmetric,  tongue midline. Motor: Left arm 3/5 but very limited by motor neglect. Left leg 4/5. Reflexes are symmetric, no pathologic reflexes present.  Sensory: Decreased to light touch and temp left arm and leg Gait: Non ambulatory  Labs: I have reviewed the data as listed    Component Value Date/Time   NA 136 08/08/2019 1128   K 4.6 08/08/2019 1128   CL 104 08/08/2019 1128   CO2 23 08/08/2019 1128   GLUCOSE 155 (H) 08/08/2019 1128   BUN 21 08/08/2019 1128   CREATININE 0.97 08/08/2019 1128   CALCIUM 9.0 08/08/2019 1128   PROT 6.6 08/08/2019 1128   ALBUMIN 3.5 08/08/2019 1128   AST 27 08/08/2019 1128   ALT 64 (H) 08/08/2019 1128   ALKPHOS 104 08/08/2019 1128   BILITOT 1.4 (H) 08/08/2019 1128   GFRNONAA >60 08/08/2019 1128   GFRAA >60 08/08/2019 1128   Lab Results  Component Value Date   WBC 14.0 (H) 08/08/2019   NEUTROABS 12.4 (H) 08/08/2019   HGB 15.6 08/08/2019   HCT 47.3 08/08/2019   MCV 96.1 08/08/2019   PLT 180 08/08/2019    Imaging:  Ct Head Wo Contrast  Result Date: 07/31/2019 CLINICAL DATA:  Follow-up biopsy for brain tumor. EXAM: CT HEAD WITHOUT CONTRAST TECHNIQUE: Contiguous axial images were obtained from the base of the skull through the vertex without intravenous contrast. COMPARISON:  MRI 07/30/2019 FINDINGS: Brain: Interval right parietal craniotomy for excision or biopsy. Biopsy defect measures about 1.5 cm in size. No evidence of hemorrhage or unexpected intracranial air. Extensive right frontoparietal mass as seen on the previous studies, with right-to-left shift of 7 mm. No other change. Vascular: No abnormal vascular finding. Skull: No abnormality other than the craniotomy. Sinuses/Orbits: Clear/normal Other: None IMPRESSION: Interval right parietal craniotomy for biopsy. No unexpected finding. Post biopsy defect measures about 1.5 cm in size. No hemorrhage or unexpected intracranial air or extra-axial collection. Electronically Signed   By: Nelson Chimes M.D.   On:  07/31/2019 07:33   Ct Chest W Contrast  Result Date: 07/20/2019 CLINICAL DATA:  CNS neoplasm, evaluate for primary malignancy EXAM: CT CHEST, ABDOMEN, AND PELVIS WITH CONTRAST TECHNIQUE: Multidetector CT imaging of the chest, abdomen and pelvis was performed following the standard protocol during bolus administration of intravenous contrast. CONTRAST:  161m OMNIPAQUE IOHEXOL 300 MG/ML  SOLN COMPARISON:  CT abdomen pelvis 09/12/2016, MR thoracic spine 01/07/2019 FINDINGS: CT CHEST FINDINGS Cardiovascular: Normal heart size. No pericardial effusion. Atherosclerotic calcification of the coronary arteries. Atherosclerotic plaque within  the normal caliber aorta. Calcifications present in the proximal great vessels as well. Central pulmonary arteries are normal caliber. Mediastinum/Nodes: Few prominent lymph nodes include a in 8 mm precarinal lymph node (1/29) and a 9 mm left hilar lymph node (1/37). No axillary adenopathy. Thyroid gland and thoracic inlet are unremarkable. Trachea is free of abnormality. Small amount of venous collateralization noted about the distal thoracic esophagus. Esophagus is otherwise unremarkable. Lungs/Pleura: There is a spiculated partially cavitating nodule in the left upper lobe measuring 2.3 x 1.9 x 1.6 cm in size. Extension of this lesion is seen to the adjacent subpleural surface and towards the major fissure. Bandlike areas of scarring and/or atelectasis are present in the left lower lobe and right middle lobe. There is mild apical predominant centrilobular emphysematous change. No other suspicious nodules or masses. Musculoskeletal: Prior anterior cervical spinal fusion. Remote compression deformity versus Schmorl's node formation noted at the T10 vertebral body. Nose acute fracture, vertebral body height loss or suspicious osseous lesion. No concerning chest wall soft tissue abnormality. CT ABDOMEN PELVIS FINDINGS Hepatobiliary: No visible or concerning hepatic lesions.  Gallbladder is largely decompressed at the time of exam. No frank gallbladder wall thickening, pericholecystic fluid or inflammation or distal calcified gallstones either within the gallbladder lumen or biliary tree. Pancreas: Unremarkable. No pancreatic ductal dilatation or surrounding inflammatory changes. Spleen: Postsurgical changes from prior splenectomy with multiple surgical clips in left upper quadrant. Adrenals/Urinary Tract: Normal adrenal glands. No suspicious adrenal lesions. Mild bilateral symmetric perinephric stranding, a nonspecific finding though may correlate with either age or decreased renal function. Subcentimeter hypoattenuating focus seen in the cortex of the right inferior pole too small to fully characterize on CT imaging but statistically likely benign. Urinary bladder is distended but otherwise unremarkable. Stomach/Bowel: Distal esophagus, stomach and duodenal sweep are unremarkable. No bowel wall thickening or dilatation. No evidence of obstruction. A normal appendix is visualized. Small surgical clip is noted near the appendiceal tip. Vascular/Lymphatic: Focal fusiform infrarenal abdominal aortic ectasia measuring up to 2.5 cm. Extensive atheromatous plaque throughout the aorta and branch vessels of the abdomen. Reproductive: The prostate and seminal vesicles are unremarkable. Other: No abdominopelvic free fluid or free gas. No bowel containing hernias. Musculoskeletal: Multilevel degenerative changes. Additional remote compression deformity versus large Schmorl's node formations are noted at the L1 and L3 levels. No suspicious osseous lesions. Suspect road remote posttraumatic deformity of the left pubic root. IMPRESSION: 1. Spiculated partially cavitating 2.3 cm nodule in the left upper lobe, highly concerning for primary bronchogenic carcinoma. Consider further evaluation with tissue sampling and/or PET-CT as clinically warranted. 2. Few prominent mediastinal and left hilar lymph  nodes, nonspecific, but metastatic disease cannot be excluded. Could be further evaluated with PET-CT. 3. No evidence of metastatic disease within the abdomen or pelvis. 4. 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 Ectatic abdominal aorta at risk for aneurysm development. Recommend followup by ultrasound in 5 years. This recommendation follows ACR consensus guidelines: White Paper of the ACR Incidental Findings Committee II on Vascular Findings. J Am Coll Radiol 2013; 10:789-794. Aortic aneurysm NOS (ICD10-I71.9) 5. Few prominent paraesophageal venous collaterals. Correlate with history of variceal disease. 6. Post splenectomy. 7. Mild bilateral symmetric perinephric stranding, a nonspecific finding though may correlate with either age or decreased renal function. 8.  Emphysema (ICD10-J43.9). 9.  Aortic Atherosclerosis (ICD10-I70.0). Electronically Signed   By: Lovena Le M.D.   On: 07/20/2019 19:19   Mr Jeri Cos KC Contrast  Result Date:  07/30/2019 CLINICAL DATA:  Brain tumor. Spiculated lung mass. Primary CNS tumor versus metastatic disease. EXAM: MRI HEAD WITHOUT AND WITH CONTRAST TECHNIQUE: Multiplanar, multiecho pulse sequences of the brain and surrounding structures were obtained without and with intravenous contrast. CONTRAST:  65m GADAVIST GADOBUTROL 1 MMOL/ML IV SOLN COMPARISON:  MRI brain 07/19/2019 FINDINGS: Brain: The a large focus of abnormal signal in the right posterior frontal and parietal lobe is again noted. A right parietal focus demonstrating restricted diffusion on the prior study is again noted to have abnormal diffusion characteristics. There is also more avid enhancement of this lesion on today's study. The area of enhancement measures 15 x 9 mm. A smaller a rounded area of enhancement is present posterior right frontal lobe measuring 6 mm. There is a focus of enhancement the posterior corona radiata measuring 4 mm on image 124 of series 11 no other  discrete lesions are present. Diffuse edema and mass effect are again noted midline shift 7 mm is not significantly changed. There is partial effacement of the right lateral ventricle. Sulci are effaced on the right. Tract agger fee was performed this demonstrates mass effect displacing cortical spinal tracts medially without significant disruption of major white matter tracts. No acute hemorrhage is present. No significant ischemic infarct is present. Vascular: Flow is present in the major intracranial arteries. Skull and upper cervical spine: Cervical fusion is noted. Craniocervical junction is normal. Midline structures are otherwise unremarkable. Sinuses/Orbits: The paranasal sinuses and mastoid air cells are clear. The globes and orbits are within normal limits. IMPRESSION: 1. Two separate cortical foci of enhancement demonstrated. There is restricted diffusion associated with the more posterior area, within the right parietal lobe. Findings are characteristic of either high-grade primary neoplasm or metastatic disease associated with a small cell lung cancer. 2. Similar appearance of extensive cerebral edema and mass effect. 3. Tract tire fee was performed demonstrate displacement without disruption of major white matter tracts. Electronically Signed   By: CSan MorelleM.D.   On: 07/30/2019 11:08   Ct Abdomen Pelvis W Contrast  Result Date: 07/20/2019 CLINICAL DATA:  CNS neoplasm, evaluate for primary malignancy EXAM: CT CHEST, ABDOMEN, AND PELVIS WITH CONTRAST TECHNIQUE: Multidetector CT imaging of the chest, abdomen and pelvis was performed following the standard protocol during bolus administration of intravenous contrast. CONTRAST:  1021mOMNIPAQUE IOHEXOL 300 MG/ML  SOLN COMPARISON:  CT abdomen pelvis 09/12/2016, MR thoracic spine 01/07/2019 FINDINGS: CT CHEST FINDINGS Cardiovascular: Normal heart size. No pericardial effusion. Atherosclerotic calcification of the coronary arteries.  Atherosclerotic plaque within the normal caliber aorta. Calcifications present in the proximal great vessels as well. Central pulmonary arteries are normal caliber. Mediastinum/Nodes: Few prominent lymph nodes include a in 8 mm precarinal lymph node (1/29) and a 9 mm left hilar lymph node (1/37). No axillary adenopathy. Thyroid gland and thoracic inlet are unremarkable. Trachea is free of abnormality. Small amount of venous collateralization noted about the distal thoracic esophagus. Esophagus is otherwise unremarkable. Lungs/Pleura: There is a spiculated partially cavitating nodule in the left upper lobe measuring 2.3 x 1.9 x 1.6 cm in size. Extension of this lesion is seen to the adjacent subpleural surface and towards the major fissure. Bandlike areas of scarring and/or atelectasis are present in the left lower lobe and right middle lobe. There is mild apical predominant centrilobular emphysematous change. No other suspicious nodules or masses. Musculoskeletal: Prior anterior cervical spinal fusion. Remote compression deformity versus Schmorl's node formation noted at the T10 vertebral body. Nose acute fracture, vertebral  body height loss or suspicious osseous lesion. No concerning chest wall soft tissue abnormality. CT ABDOMEN PELVIS FINDINGS Hepatobiliary: No visible or concerning hepatic lesions. Gallbladder is largely decompressed at the time of exam. No frank gallbladder wall thickening, pericholecystic fluid or inflammation or distal calcified gallstones either within the gallbladder lumen or biliary tree. Pancreas: Unremarkable. No pancreatic ductal dilatation or surrounding inflammatory changes. Spleen: Postsurgical changes from prior splenectomy with multiple surgical clips in left upper quadrant. Adrenals/Urinary Tract: Normal adrenal glands. No suspicious adrenal lesions. Mild bilateral symmetric perinephric stranding, a nonspecific finding though may correlate with either age or decreased renal  function. Subcentimeter hypoattenuating focus seen in the cortex of the right inferior pole too small to fully characterize on CT imaging but statistically likely benign. Urinary bladder is distended but otherwise unremarkable. Stomach/Bowel: Distal esophagus, stomach and duodenal sweep are unremarkable. No bowel wall thickening or dilatation. No evidence of obstruction. A normal appendix is visualized. Small surgical clip is noted near the appendiceal tip. Vascular/Lymphatic: Focal fusiform infrarenal abdominal aortic ectasia measuring up to 2.5 cm. Extensive atheromatous plaque throughout the aorta and branch vessels of the abdomen. Reproductive: The prostate and seminal vesicles are unremarkable. Other: No abdominopelvic free fluid or free gas. No bowel containing hernias. Musculoskeletal: Multilevel degenerative changes. Additional remote compression deformity versus large Schmorl's node formations are noted at the L1 and L3 levels. No suspicious osseous lesions. Suspect road remote posttraumatic deformity of the left pubic root. IMPRESSION: 1. Spiculated partially cavitating 2.3 cm nodule in the left upper lobe, highly concerning for primary bronchogenic carcinoma. Consider further evaluation with tissue sampling and/or PET-CT as clinically warranted. 2. Few prominent mediastinal and left hilar lymph nodes, nonspecific, but metastatic disease cannot be excluded. Could be further evaluated with PET-CT. 3. No evidence of metastatic disease within the abdomen or pelvis. 4. 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 Ectatic abdominal aorta at risk for aneurysm development. Recommend followup by ultrasound in 5 years. This recommendation follows ACR consensus guidelines: White Paper of the ACR Incidental Findings Committee II on Vascular Findings. J Am Coll Radiol 2013; 10:789-794. Aortic aneurysm NOS (ICD10-I71.9) 5. Few prominent paraesophageal venous collaterals. Correlate with  history of variceal disease. 6. Post splenectomy. 7. Mild bilateral symmetric perinephric stranding, a nonspecific finding though may correlate with either age or decreased renal function. 8.  Emphysema (ICD10-J43.9). 9.  Aortic Atherosclerosis (ICD10-I70.0). Electronically Signed   By: Lovena Le M.D.   On: 07/20/2019 19:19   Dg Swallowing Func-speech Pathology  Result Date: 07/21/2019 Objective Swallowing Evaluation: Type of Study: MBS-Modified Barium Swallow Study  Patient Details Name: Gabriel Atkins MRN: [id number] Date of Birth: 20-Mar-1951 Today's Date: 07/21/2019 Time: SLP Start Time (ACUTE ONLY): 0932 -SLP Stop Time (ACUTE ONLY): 5732 SLP Time Calculation (min) (ACUTE ONLY): 15 min Past Medical History: Past Medical History: Diagnosis Date  Arthritis   GERD (gastroesophageal reflux disease)   History of hiatal hernia   Hypertension  Past Surgical History: Past Surgical History: Procedure Laterality Date  ANTERIOR CERVICAL DECOMPRESSION/DISCECTOMY FUSION 4 LEVELS N/A 05/21/2019  Procedure: ANTERIOR CERVICAL DECOMPRESSION/DISCECTOMY FUSION - CERVICAL THREE-FOUR, CERVICAL FOUR-FIVE, CERVICAL FIVE-SIX, CERVICAL SIX-SEVEN;  Surgeon: Kary Kos, MD;  Location: [facility];  Service: Neurosurgery;  Laterality: N/A;  CYST EXCISION    Left Groin  SPLENECTOMY, TOTAL   HPI: Gabriel Atkins is a 68 y.o. male with medical history significant for arthritis, GERD, hypertension, and recent anterior cervical decompression/discectomy-fusion at 4 levels from C3-C7 on 05/21/2019, who  was at his usual baseline when he awoke this morning to get coffee at 8 AM.  His wife then heard a fall associated with some left-sided weakness and aphasia.  He then does not recall what happened, but according to the wife, he was noted to have some generalized shaking as well as some jerking of his left arm.  The next thing the patient recalls is EMS arriving at his home.  Apparently, his wife had called the neurosurgeon on call, Dr. Vertell Limber, who  had recommended that the patient come to the ED.  The patient has had improvement in his aphasia and is able to give me a history at this time.  He denies any headache, visual changes.  He states that his weakness is intermittent, but improving.  He is right-handed.  MRI of the head was completed and showing a large up to 9 cm infiltrative, predominantly T2 hyperintense mass in the right hemisphere with multilobar involvement. Only trace  Subjective: alert, pleasant, conversant Assessment / Plan / Recommendation CHL IP CLINICAL IMPRESSIONS 07/21/2019 Clinical Impression Pt presents with functional oropharyngeal abilities that are c/b timely swallow initiation, good epiglottic deflection (despite hardware) and complete airway protection when consuming thin liquids, barium tablet whole and solids. Recommend continuing current diet of regular with thin liquids, medicine whole.  SLP Visit Diagnosis Dysphagia, unspecified (R13.10) Attention and concentration deficit following -- Frontal lobe and executive function deficit following -- Impact on safety and function No limitations   CH[facility] IP TREATMENT RECOMMENDATION 07/21/2019 Treatment Recommendations No treatment recommended at this time   No flowsheet data found. [facility] IP DIET RECOMMENDATION 07/21/2019 SLP Diet Recommendations Regular solids;Thin liquid Liquid Administration via Spoon;Cup;Straw Medication Administration Whole meds with liquid Compensations Minimize environmental distractions;Slow rate;Small sips/bites Postural Changes Seated upright at 90 degrees   [facility] IP OTHER RECOMMENDATIONS 07/21/2019 Recommended Consults -- Oral Care Recommendations Oral care BID Other Recommendations --   [facility] IP FOLLOW UP RECOMMENDATIONS 07/20/2019 Follow up Recommendations Other (comment)   No flowsheet data found.     [facility]L IP ORAL PHASE 07/21/2019 Oral Phase WFL Oral - Pudding Teaspoon -- Oral - Pudding Cup -- Oral - Honey Teaspoon -- Oral - Honey Cup -- Oral - Nectar Teaspoon -- Oral -  Nectar Cup -- Oral - Nectar Straw -- Oral - Thin Teaspoon -- Oral - Thin Cup -- Oral - Thin Straw -- Oral - Puree -- Oral - Mech Soft -- Oral - Regular -- Oral - Multi-Consistency -- Oral - Pill -- Oral Phase - Comment --  [facility]L IP PHARYNGEAL PHASE 07/21/2019 Pharyngeal Phase WFL Pharyngeal- Pudding Teaspoon -- Pharyngeal -- Pharyngeal- Pudding Cup -- Pharyngeal -- Pharyngeal- Honey Teaspoon -- Pharyngeal -- Pharyngeal- Honey Cup -- Pharyngeal -- Pharyngeal- Nectar Teaspoon -- Pharyngeal -- Pharyngeal- Nectar Cup -- Pharyngeal -- Pharyngeal- Nectar Straw -- Pharyngeal -- Pharyngeal- Thin Teaspoon -- Pharyngeal -- Pharyngeal- Thin Cup -- Pharyngeal -- Pharyngeal- Thin Straw -- Pharyngeal -- Pharyngeal- Puree -- Pharyngeal -- Pharyngeal- Mechanical Soft -- Pharyngeal -- Pharyngeal- Regular -- Pharyngeal -- Pharyngeal- Multi-consistency -- Pharyngeal -- Pharyngeal- Pill -- Pharyngeal -- Pharyngeal Comment --  CHL IP CERVICAL ESOPHAGEAL PHASE 07/21/2019 Cervical Esophageal Phase WFL Pudding Teaspoon -- Pudding Cup -- Honey Teaspoon -- Honey Cup -- Nectar Teaspoon -- Nectar Cup -- Nectar Straw -- Thin Teaspoon -- Thin Cup -- Thin Straw -- Puree -- Mechanical Soft -- Regular -- Multi-consistency -- Pill -- Cervical Esophageal Comment -- Happi Overton 07/21/2019, 8:48 AM  Pathology: Pending scan in EMR Glioblastoma, IDH-wt (per paper report faxed to [place] through Enid Cutter MD)  Assessment/Plan Glioblastoma, 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  In 3 weeks he has progressed from functionally independent to relying on aid for all ADLs and losing ambulatory ability.  He has relatively poor insight into the nature of his condition and relied on his wife for much of history and discussion today.    There are significant barriers to treatment in his case:  -Wife is  disabled and there is no other family to help.  They relied on a friend to drive today -Wife would be unable to transport him daily (1 hour) from their home for radiation -Patient is difficult to move because of motor impairment and neglect  We discussed his prognosis given functional status, tumor size and location, histology, genetics, as well as likely concurrent lung cancer which has yet to be diagnosed formally.  Given these factors, expected survival even with treatment is likely less than 6 months.  We presented information on hospice and comfort care as an alternative to traditional cancer treatments.  Screening for potential clinical trials was performed and discussed using eligibility criteria for active protocols at [facility], loco-regional tertiary centers, as well as national database available on [email or web address].    The patient is not a candidate for a research protocol at this time due to poor functional status.   We spent twenty additional minutes teaching regarding the natural history, biology, and historical experience in the treatment of brain tumors. We then discussed in detail the current recommendations for therapy focusing on the mode of administration, mechanism of action, anticipated toxicities, and quality of life issues associated with this plan. We also provided teaching sheets for the patient to take home as an additional resource.  At this time, we will follow up with Mr. Crass and his wife with a phone call in two days.  They will take time to discuss the topics breached today including goals of care.  If they would like to proceed with radiation, we will help arrange those plans to best accommodate their needs.  In the meantime, other visits and scans may be deferred pending goals of care decisions.  All questions were answered. The patient knows to call the clinic with any problems, questions or concerns. No barriers to learning were detected.  The total time  spent in the encounter was 60 minutes and more than 50% was on counseling and review of test results   Ventura Sellers, MD Medical Director of Neuro-Oncology [facility] at [place] 08/19/19 9:39 AM

## 2019-08-19 NOTE — Telephone Encounter (Signed)
Patient scheduled for appt w/Dr.Kale at 10/14 @ 2p and PET 10/14 @ 10/14. Dr. Irene Limbo asked that his appt w/patient be r/s to 10/15 so that he has results of PET.  Contacted patient's wife to advise of this change.  Wife stated that patient had appt w/Dr.Vaslow this morning and she states that after the appt, Dr. Mickeal Skinner  told her to pause appointments for MDs, tests and treatments until he has the chance to discuss a plan of care with patient/wife on Thursday. Appts can be rescheduled following conversation with Dr. Mickeal Skinner. Ms. Fayette verbalized understanding. Appts for PET and Dr. Irene Limbo on 10/14 cancelled.

## 2019-08-20 ENCOUNTER — Ambulatory Visit: Payer: Medicare Other | Admitting: Hematology

## 2019-08-20 ENCOUNTER — Ambulatory Visit: Payer: Medicare Other | Admitting: Radiation Oncology

## 2019-08-20 ENCOUNTER — Telehealth: Payer: Self-pay | Admitting: Internal Medicine

## 2019-08-20 ENCOUNTER — Encounter (HOSPITAL_COMMUNITY): Payer: Medicare Other

## 2019-08-20 LAB — SURGICAL PATHOLOGY

## 2019-08-20 NOTE — Telephone Encounter (Signed)
I talk with patient regarding 10/15

## 2019-08-20 NOTE — Telephone Encounter (Signed)
Contacted patient to verify telephone visit for pre reg °

## 2019-08-21 ENCOUNTER — Encounter (HOSPITAL_COMMUNITY): Payer: Self-pay

## 2019-08-21 ENCOUNTER — Inpatient Hospital Stay (HOSPITAL_BASED_OUTPATIENT_CLINIC_OR_DEPARTMENT_OTHER): Payer: Medicare Other | Admitting: Internal Medicine

## 2019-08-21 ENCOUNTER — Ambulatory Visit (HOSPITAL_COMMUNITY): Payer: Medicare Other

## 2019-08-21 DIAGNOSIS — C719 Malignant neoplasm of brain, unspecified: Secondary | ICD-10-CM

## 2019-08-21 NOTE — Telephone Encounter (Signed)
"  Gabriel Atkins [phone]).  Need verbal order for nursing and physical therapy visit on 08-18-2019 despite patient currently under Hospice."

## 2019-08-21 NOTE — Progress Notes (Signed)
I connected with Narda Rutherford on 08/21/19 at  9:30 AM EDT by telephone visit and verified that I am speaking with the correct person using two identifiers.   I discussed the limitations, risks, security and privacy concerns of performing an evaluation and management service by telemedicine and the availability of in-person appointments. I also discussed with the patient that there may be a patient responsible charge related to this service. The patient expressed understanding and agreed to proceed.   Other persons participating in the visit and their role in the encounter:  Spouse  Patient's location:  Home  Provider's location:  Office  Chief Complaint: Glioblastoma  History of Present Ilness: Mr. Cuff continues to have declining status, experienced two additional falls in the past 3 days.  No improvements noted, he does not feel "up for" going through radiation and chemo Observations: Attentional issues noted as prior Assessment and Plan: Reviewed goals of care in detail; patient and spouse would like to move forward with hospice referral Follow Up Instructions: Will contact hospice of [employer], continue on as primary attending.  I discussed the assessment and treatment plan with the patient.  The patient was provided an opportunity to ask questions and all were answered.  The patient agreed with the plan and demonstrated understanding of the instructions.    The patient was advised to call back or seek an in-person evaluation if the symptoms worsen or if the condition fails to improve as anticipated.  I provided 5-10 minutes of non-face-to-face time during this enocunter.  Ventura Sellers, MD   I provided 15 minutes of non face-to-face telephone visit time during this encounter, and > 50% was spent counseling as documented under my assessment & plan.

## 2019-08-22 ENCOUNTER — Telehealth: Payer: Self-pay | Admitting: Internal Medicine

## 2019-08-22 ENCOUNTER — Telehealth: Payer: Self-pay | Admitting: *Deleted

## 2019-08-22 NOTE — Telephone Encounter (Signed)
No los per 10/15.

## 2019-08-22 NOTE — Telephone Encounter (Signed)
Home Health referral for nurse/PT made on 10/9. [facility] nurse/PT made first visit on Monday 10/12. Following office visit on 10/14, pt was referred to [facility] by Dr. Mickeal Skinner. Contacted by [facility], Evalee Jefferson [phone] -  orders needed for the one day of service provided.  Dr.Kale gave verbal order for one day of service provided by [facility] prior to referral to hospice.

## 2019-08-25 ENCOUNTER — Telehealth: Payer: Self-pay

## 2019-08-25 NOTE — Telephone Encounter (Signed)
Received call from patient spouse Gabriel Atkins asking if the patient needs to go to his intial pulmonary appt tomorrow. She stated that he is so weak that she does not know how she will get him to the appt. Hospice has been initiated and they have already been out to the house. Explained that it is appropriate to cancel the pulmonary appt since the patient is pursuing hospice care at this time. Gabriel Atkins verbalized understanding and had no other questions or concerns.

## 2019-08-26 ENCOUNTER — Institutional Professional Consult (permissible substitution): Payer: Medicare Other | Admitting: Pulmonary Disease

## 2019-10-07 DEATH — deceased

## 2019-10-15 ENCOUNTER — Encounter: Payer: Self-pay | Admitting: *Deleted

## 2019-12-23 ENCOUNTER — Telehealth: Payer: Self-pay | Admitting: *Deleted

## 2019-12-23 NOTE — Telephone Encounter (Signed)
Received faxed request for Dr. Irene Limbo to review/signature of [place] orders for time period 08/18/2019 - 10/16/2019 Faxed signed orders to [phone] - fax confirmation received

## 2020-09-18 IMAGING — CT CT CERVICAL SPINE W/O CM
3 of 10 series · 8 of 33 positions shown, 9 images · non-contrast
Comparison: MRI 01/07/2019

CLINICAL DATA: Patient found beside bed with left-sided weakness.

EXAM:
CT CERVICAL SPINE WITHOUT CONTRAST
TECHNIQUE: Multidetector CT imaging of the cervical spine was performed without
intravenous contrast. Multiplanar CT image reconstructions were also
generated.

[Series 12: c spine soft · axial · 0.35mm/px · z∈[-269,-171]mm · 3 of 99 slices shown]
[im 25/99  soft-tissue]
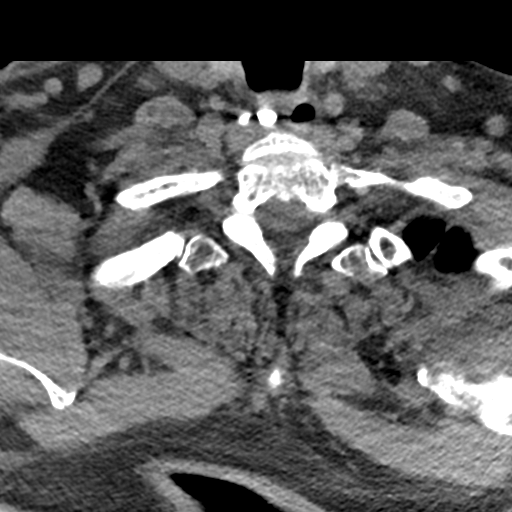
[im 50/99  soft-tissue]
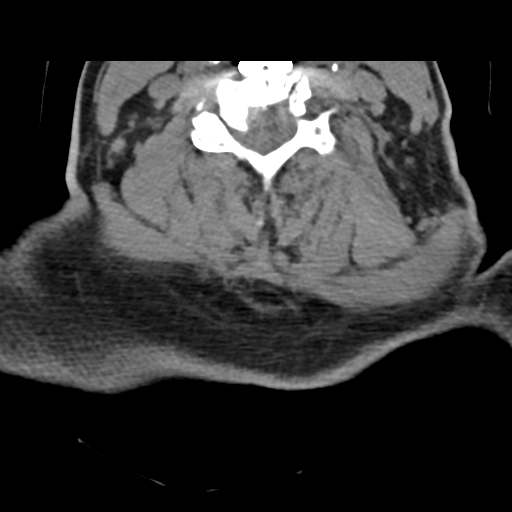
[im 74/99  soft-tissue]
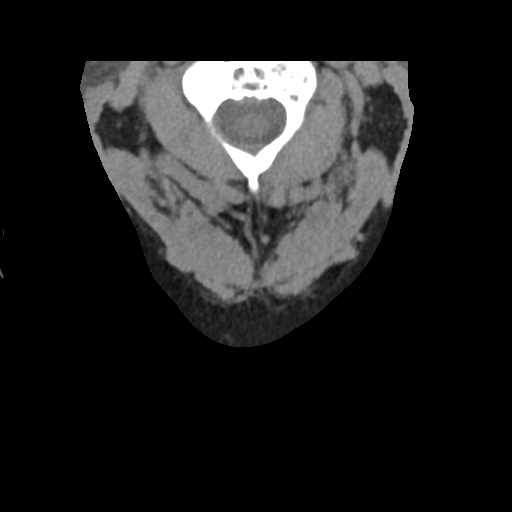

[Series 13: sag bone · sagittal · 0.29mm/px · 2 of 61 slices shown]
[im 21/61  bone]
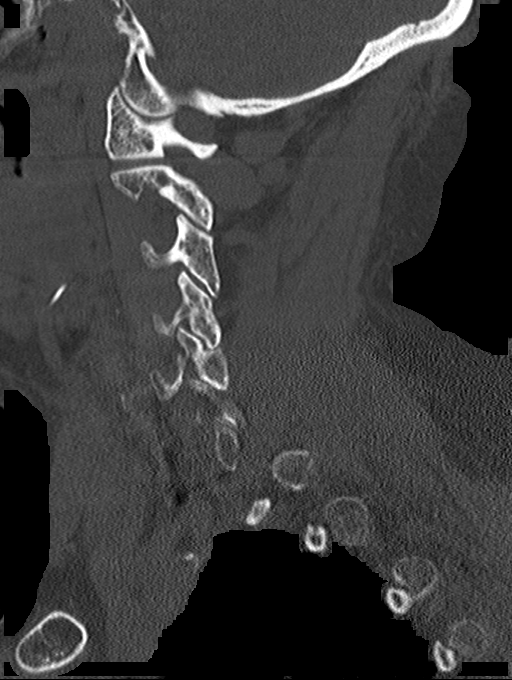
[im 41/61  bone]
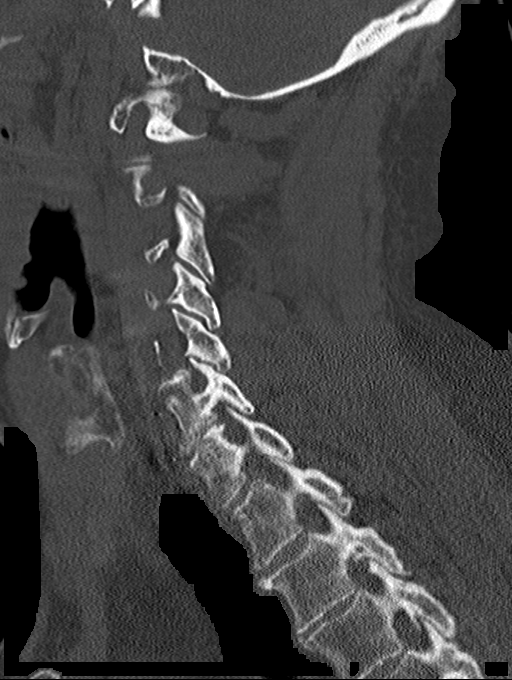

[Series 15: orthogonal axials · axial · 0.21mm/px · z∈[-291,-201]mm · 3 of 105 slices shown, 4 images]
[im 27/105  soft-tissue]
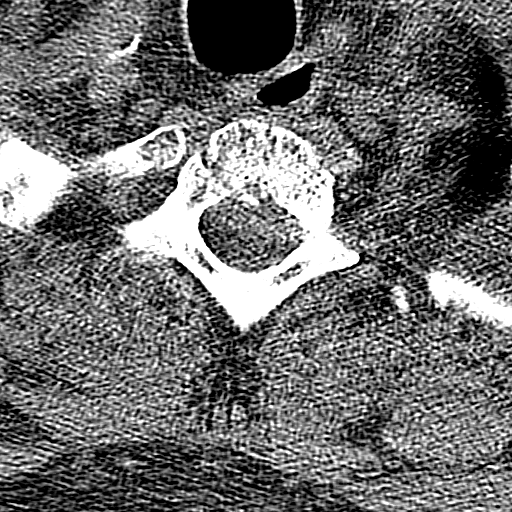
[im 27/105  bone]
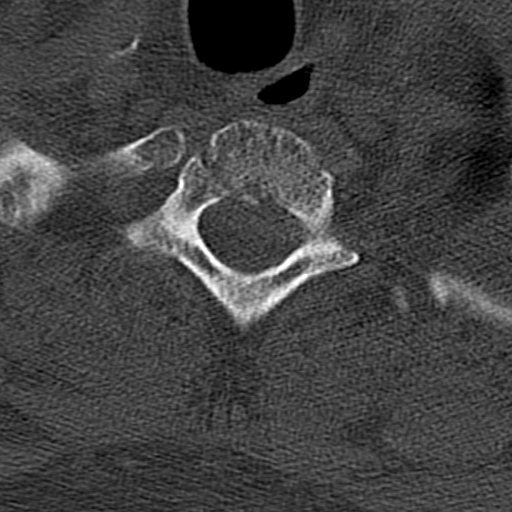
[im 53/105  bone]
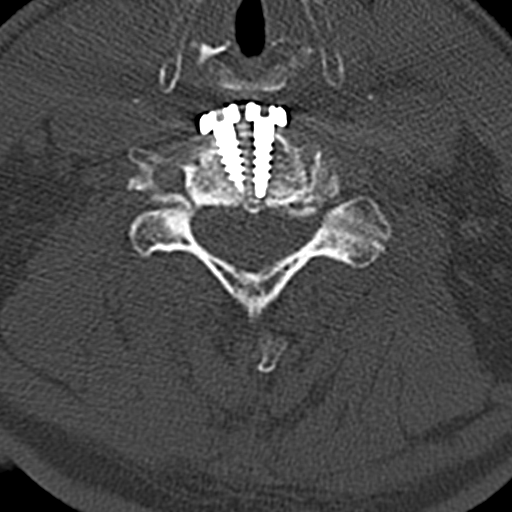
[im 79/105  bone]
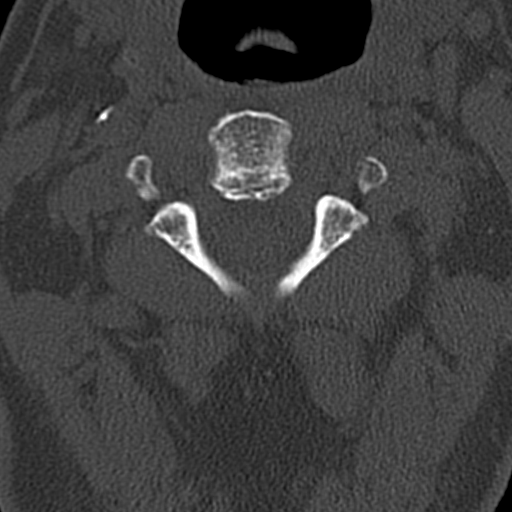

[8 of 33 positions shown; findings below may reference images not displayed]

FINDINGS: Alignment: Normal.

Skull base and vertebrae: Anterior fusion hardware intact from
C3-C7. Vertebral body heights are normal. There is mild spondylosis
throughout the cervical spine. There is moderate uncovertebral joint
spurring. Atlantoaxial articulation is normal. There is significant
multilevel bilateral neural foraminal narrowing most prominent from
the C4-5 to the C6-7 levels. No acute fracture or traumatic
subluxation.

Soft tissues and spinal canal: No prevertebral fluid or swelling. No
visible canal hematoma.

Disc levels:  Interbody fusion from C3-C7.

Upper chest: No acute findings.

Other: None.
IMPRESSION: No acute findings.

Mild spondylosis throughout the cervical spine. Significant
bilateral neural foraminal narrowing at multiple levels as
described. Anterior fusion hardware intact from C3-C7.

## 2020-09-18 IMAGING — MR MR HEAD WO/W CM
14 of 20 series · 33 of 48 positions shown · IV contrast (gadavist)
Comparison: Head CT at 2056 hours.

CLINICAL DATA: 68-year-old male with presentation of shaking and
weakness. Masslike abnormality on noncontrast head CT earlier today.

EXAM:
MRI HEAD WITHOUT AND WITH CONTRAST
TECHNIQUE: Multiplanar, multiecho pulse sequences of the brain and surrounding
structures were obtained without and with intravenous contrast.
CONTRAST:  8mL GADAVIST GADOBUTROL 1 MMOL/ML IV SOLN

[Series 5: DWI · axial · 3.0mm · 0.88mm/px · z∈[-13,+137]mm · 5 of 104 slices shown (1 of 4)]
[im 1/104]
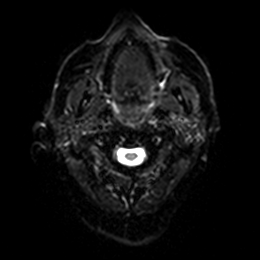
[im 26/104]
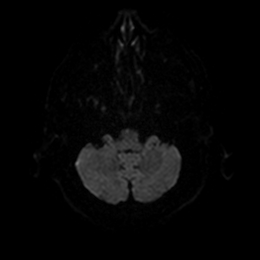
[im 52/104]
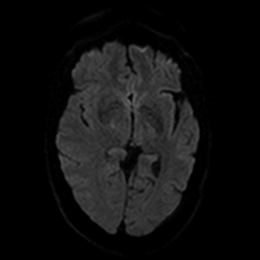
[im 78/104]
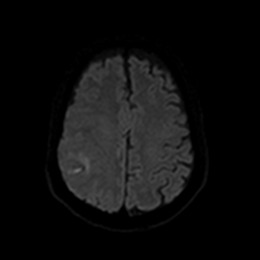
[im 104/104]
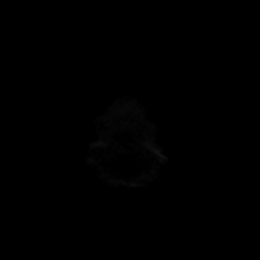

[Series 6: DWI · axial · 3.0mm · 0.88mm/px · z∈[-13,+137]mm · 2 of 49 slices shown (2 of 4)]
[im 1/49]
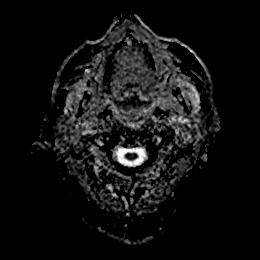
[im 49/49]
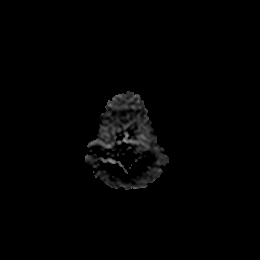

[Series 7: DWI · coronal · 4.0mm · 0.88mm/px · 4 of 72 slices shown (3 of 4)]
[im 1/72]
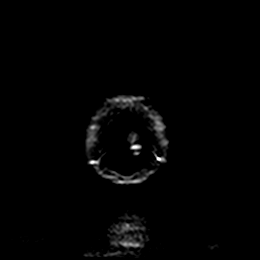
[im 24/72]
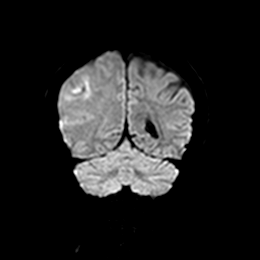
[im 48/72]
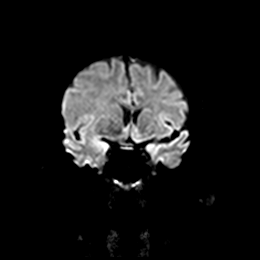
[im 72/72]
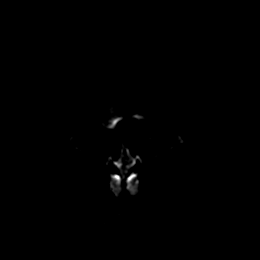

[Series 8: DWI · coronal · 4.0mm · 0.88mm/px · 2 of 36 slices shown (4 of 4)]
[im 1/36]
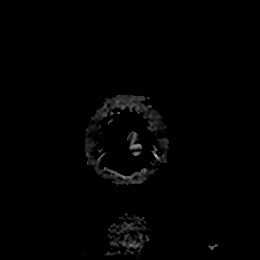
[im 36/36]
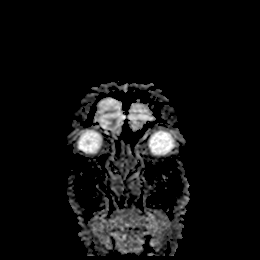

[Series 9: FLAIR · axial · 5.0mm · 0.45mm/px · 1 of 25 slices shown]
[im 1/25]
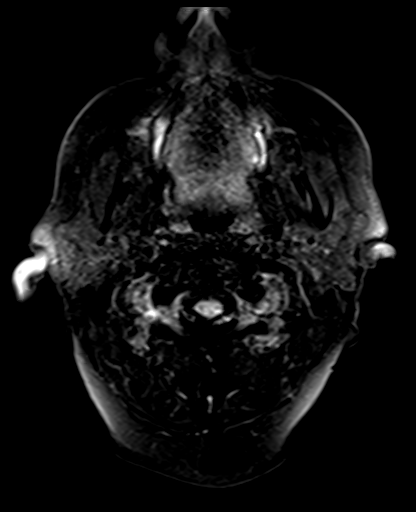

[Series 10: mag_images · axial · 3.0mm · 0.90mm/px · z∈[-19,+143]mm · 3 of 56 slices shown]
[im 1/56]
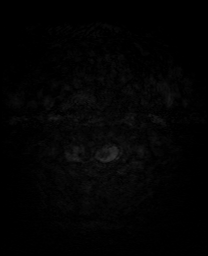
[im 28/56]
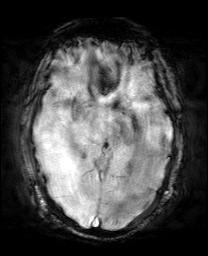
[im 56/56]
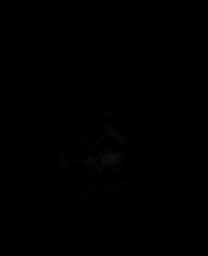

[Series 11: pha_images · axial · 3.0mm · 0.90mm/px · z∈[-7,+143]mm · 3 of 52 slices shown]
[im 1/52]
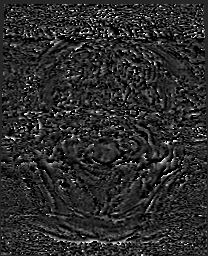
[im 26/52]
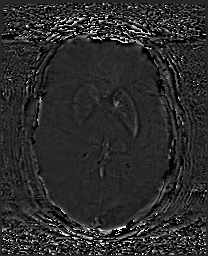
[im 52/52]
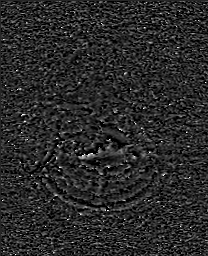

[Series 12: swi_images · axial · 3.0mm · 0.90mm/px · z∈[-19,+143]mm · 3 of 56 slices shown]
[im 1/56]
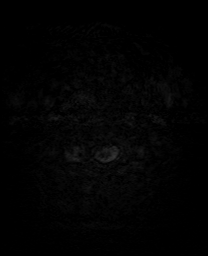
[im 28/56]
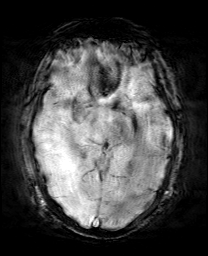
[im 56/56]
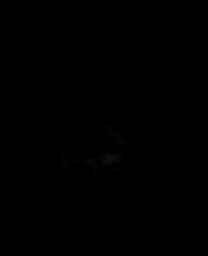

[Series 13: mip_images(sw) · axial · 24.0mm · 0.90mm/px · z∈[-9,+133]mm · 3 of 49 slices shown]
[im 1/49]
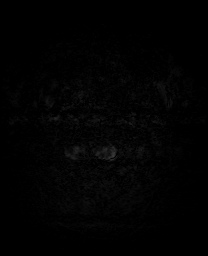
[im 25/49]
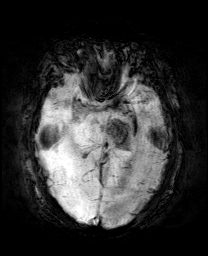
[im 49/49]
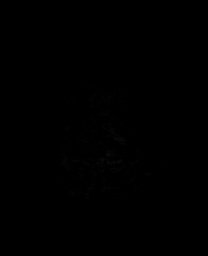

[Series 14: T2 · axial · 5.0mm · 0.72mm/px · 1 of 25 slices shown]
[im 1/25]
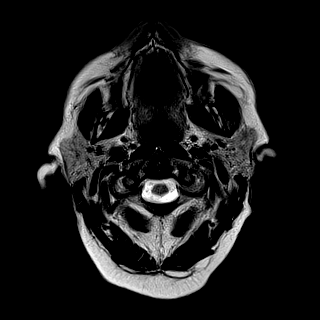

[Series 16: T1 · sagittal · 5.0mm · 0.75mm/px · 1 of 23 slices shown]
[im 1/23]
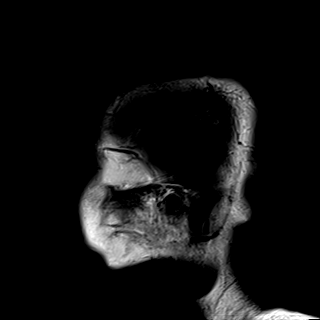

[Series 17: T2 post-contrast · coronal · 5.0mm · 0.72mm/px · 2 of 28 slices shown]
[im 1/28]
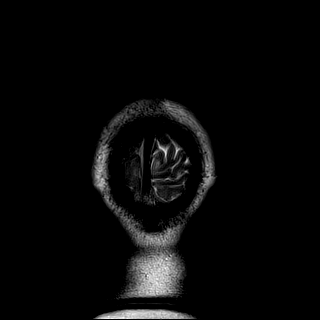
[im 28/28]
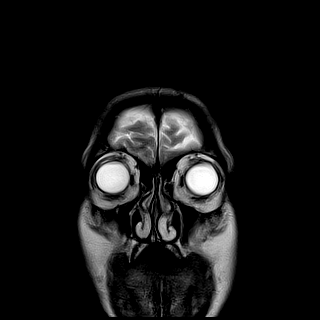

[Series 19: T1 post-contrast · coronal · 5.0mm · 0.34mm/px · 2 of 28 slices shown (1 of 2)]
[im 1/28]
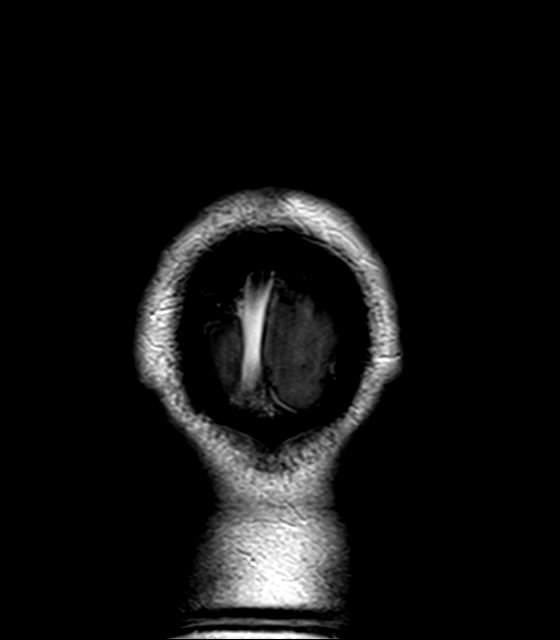
[im 28/28]
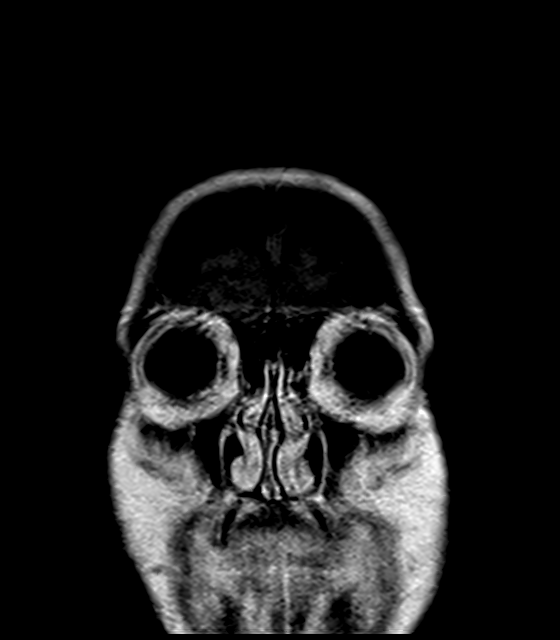

[Series 20: T1 post-contrast · sagittal · 5.0mm · 0.72mm/px · 1 of 23 slices shown (2 of 2)]
[im 1/23]
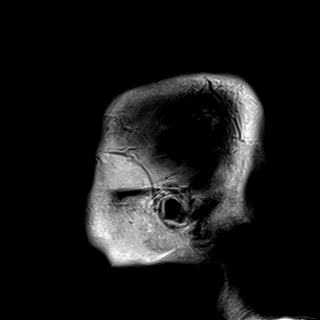

[33 of 48 positions shown; findings below may reference images not displayed]

FINDINGS: Brain: Within the right hemisphere there is a large masslike area of
heterogeneously increased T2 and FLAIR hyperintensity with
indistinct margins (series 9, image 17), T1 hypointensity. The right
frontal, parietal, posterior temporal and anterior occipital lobes
are affected.

Following contrast only trace petechial type abnormal postcontrast
enhancement seen in the right parietal region on series 18, image 43
and series 19, image 9. The abnormality encompasses 92 x 63 by 64
millimeters (AP by transverse by CC), and does appear to track into
the splenium of the corpus callosum on series 9, image 16. The
remaining corpus callosum appears spared.

On DWI there is a heterogeneous region of partially restricted
diffusion in the right parietal lobe which corresponds to the area
of petechial enhancement (series 5 images 89-91). There may be trace
hemosiderin in that region. Elsewhere diffusion is facilitated.

Associated intracranial mass effect with 8 millimeters of leftward
midline shift and partial effacement of the right lateral ventricle.
Basilar cisterns remain patent.

No signal abnormality or similar findings in the left hemisphere
hemisphere, brainstem or cerebellum. No dural thickening. No other
abnormal intracranial enhancement. No superimposed acute infarct. No
restricted diffusion to suggest acute infarction. No
ventriculomegaly, extra-axial collection or acute intracranial
hemorrhage. Cervicomedullary junction and pituitary are within
normal limits.

Vascular: Major intracranial vascular flow voids are preserved. The
major dural venous sinuses are enhancing and appear to be patent.

Skull and upper cervical spine: Partially visible cervical ACDF.
Visualized bone marrow signal is within normal limits.

Sinuses/Orbits: Negative orbits. Paranasal Visualized paranasal
sinuses and mastoids are stable and well pneumatized.

Other: Visible internal auditory structures appear normal. Scalp and
face soft tissues appear negative.
IMPRESSION: 1. Large up to 9 cm infiltrative, predominantly T2 hyperintense mass
in the right hemisphere with multilobar involvement. Only trace
petechial enhancement in a right parietal region where
hypercellularity is suspected on diffusion imaging. Some involvement
of the splenium is suspected, but no extension across midline.
The features suggest an infiltrative Glioma with mixed low and
high-grade components.
High-grade Astrocytoma and Glioblastoma are possible. Gliomatosis
cerebri is possible. CNS lymphoma is less likely.
2. Stable intracranial mass effect from earlier today.

## 2020-09-18 IMAGING — CT CT HEAD CODE STROKE
4 of 10 series · 14 of 47 positions shown, 18 images · non-contrast
Comparison: None.

CLINICAL DATA: Code stroke. Episode of arm shaking and weakness
today. No symptoms prior to today.

EXAM:
CT HEAD WITHOUT CONTRAST
TECHNIQUE: Contiguous axial images were obtained from the base of the skull
through the vertex without intravenous contrast.

[Series 9: sagittal soft · sagittal · 0.37mm/px · 1 of 67 slices shown, 2 images]
[im 34/67  brain]
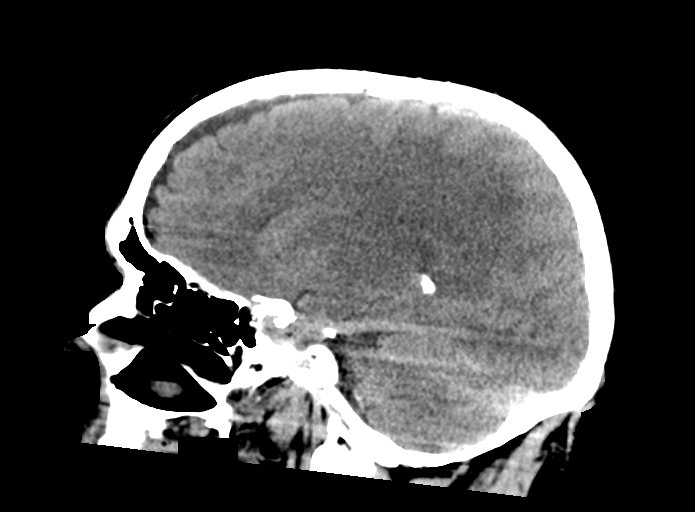
[im 34/67  bone]
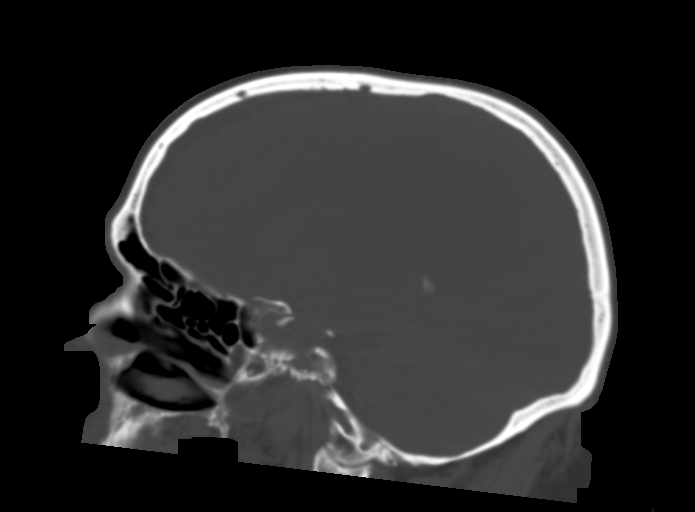

[Series 12: c spine soft · axial · 0.35mm/px · z∈[-299,-279]mm · 2 of 99 slices shown]
[im 10/99  brain]
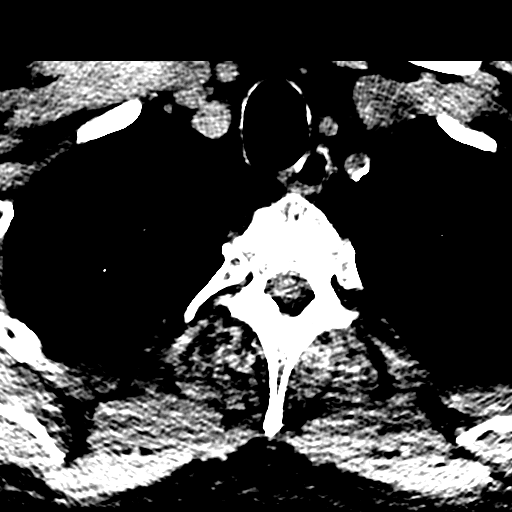
[im 20/99  brain]
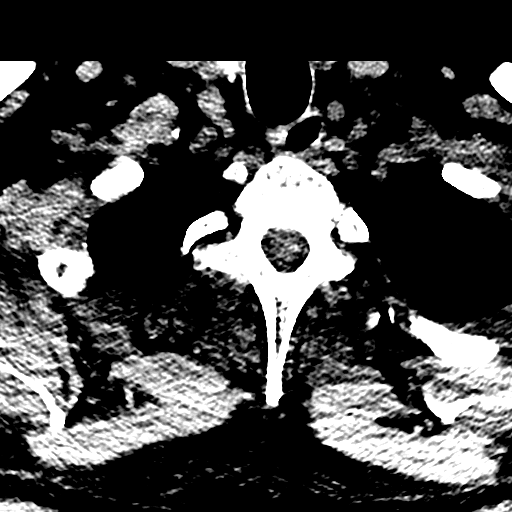

[Series 14: cor bone · coronal · 0.29mm/px · 1 of 66 slices shown]
[im 33/66  bone]
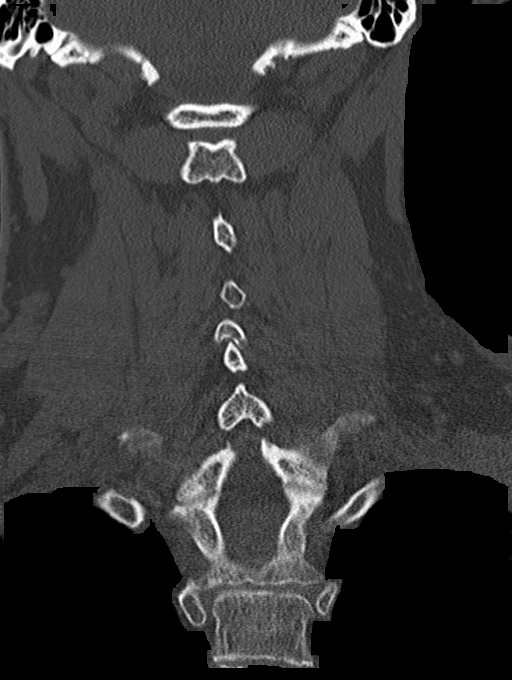

[Series 15: orthogonal axials · axial · 0.21mm/px · z∈[-320,-174]mm · 10 of 105 slices shown, 13 images]
[im 10/105  brain]
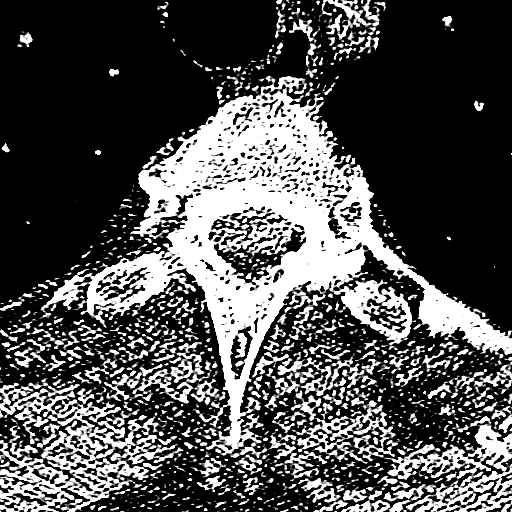
[im 10/105  bone]
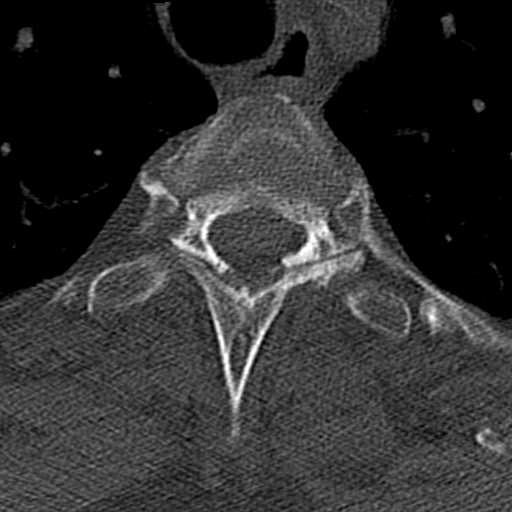
[im 19/105  bone]
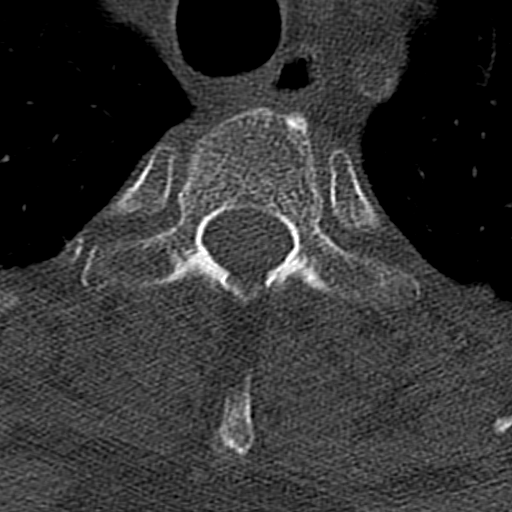
[im 29/105  bone]
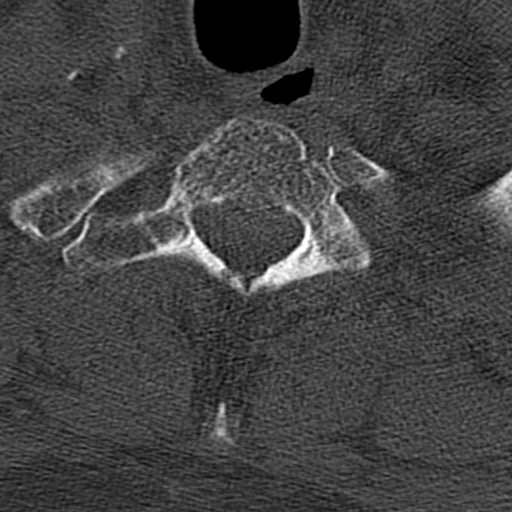
[im 38/105  bone]
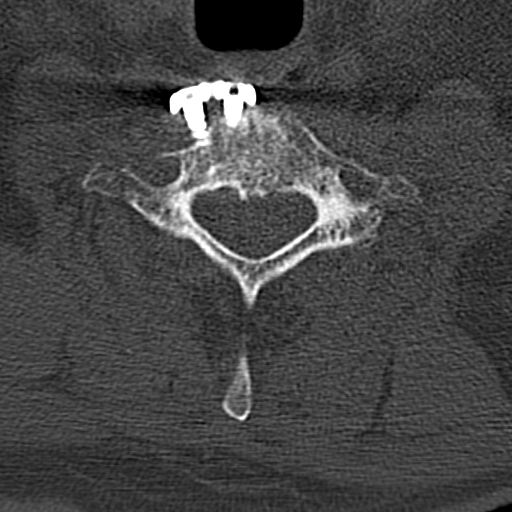
[im 48/105  brain]
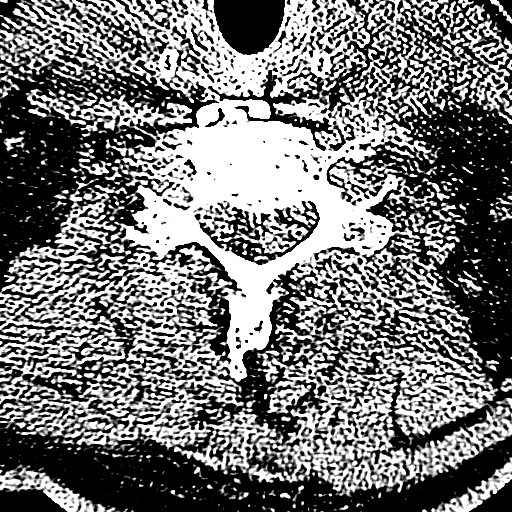
[im 48/105  bone]
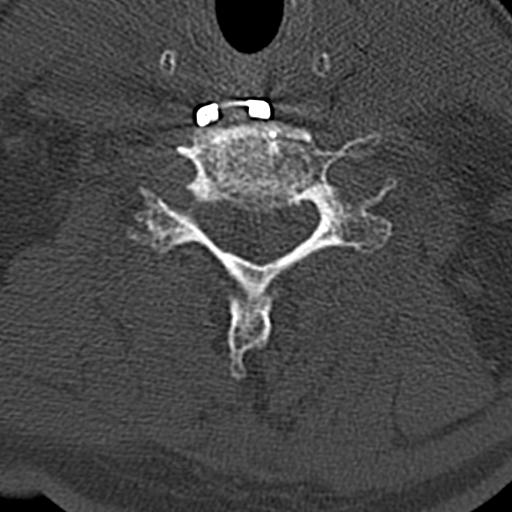
[im 57/105  bone]
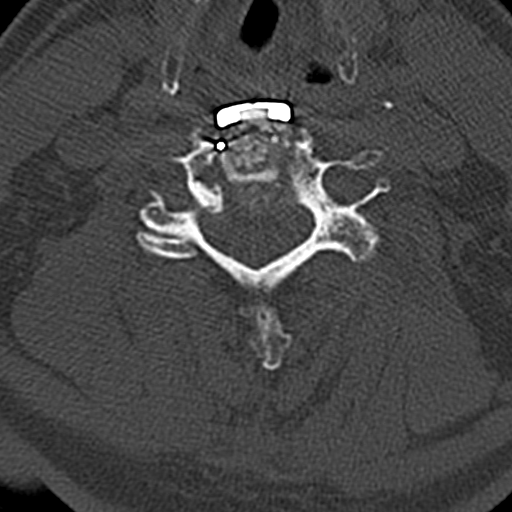
[im 67/105  bone]
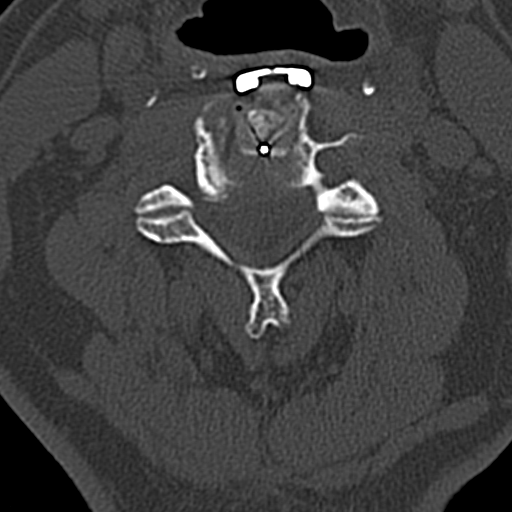
[im 76/105  bone]
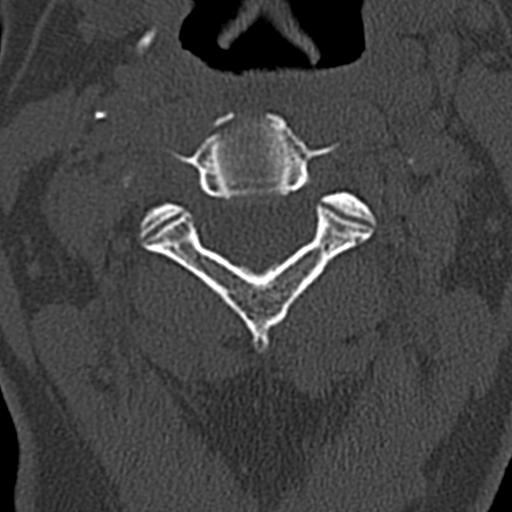
[im 86/105  brain]
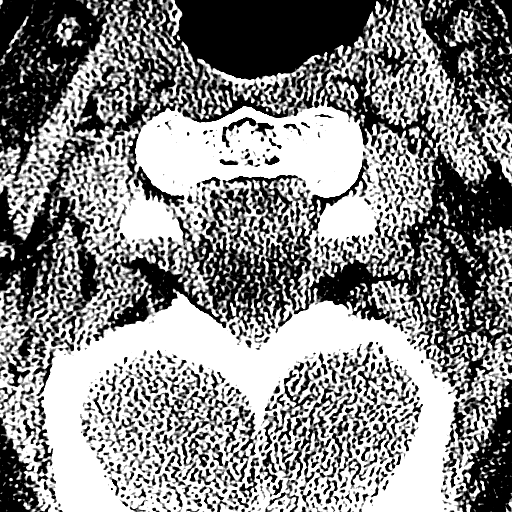
[im 86/105  bone]
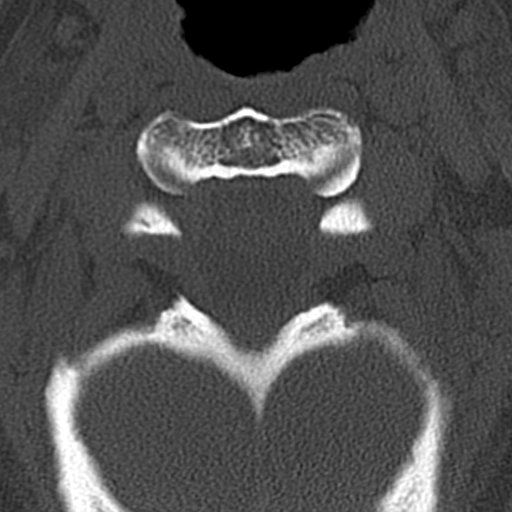
[im 95/105  bone]
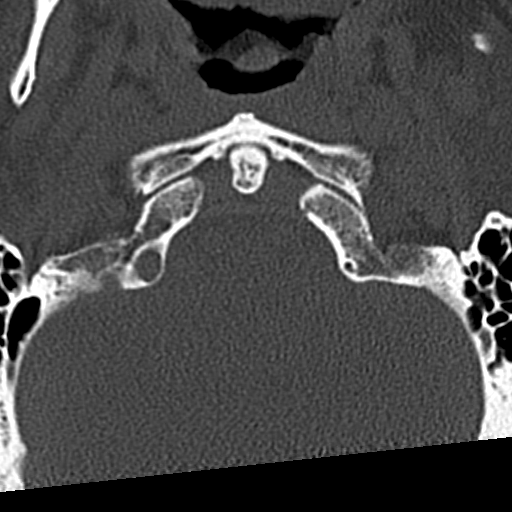

[14 of 47 positions shown; findings below may reference images not displayed]

FINDINGS: Brain: Masslike abnormality with high-density/preserved cortex in
the right frontal and parietal region with midline shift measuring 8
mm. Although subacute infarct with petechial hemorrhage is
considered there were no symptoms yesterday such that mass with
vasogenic edema and seizure is most likely. No hematoma or
hydrocephalus.

Vascular: No hyperdense vessel.

Skull: Negative

Sinuses/Orbits: Negative

Other: These results were called by telephone at the time of
interpretation on 07/19/2019 at [DATE] to provider CHINENYE DAHL
, who verbally acknowledged these results.

ASPECTS ([place] Stroke Program Early CT Score)

Not scored in this setting
IMPRESSION: Masslike abnormality centered along the high right frontal parietal
lobe with vasogenic edema and 8 mm midline shift. Recommend brain
MRI with contrast.
